# Patient Record
Sex: Male | Born: 1990 | Hispanic: Yes | Marital: Single | Smoking: Never smoker
Health system: Southern US, Community
[De-identification: ages and names within clinical notes are randomized; demographics above are authoritative.]

## PROBLEM LIST (undated history)

## (undated) DIAGNOSIS — Z789 Other specified health status: Secondary | ICD-10-CM

## (undated) HISTORY — PX: OTHER SURGICAL HISTORY: SHX169

---

## 2015-10-26 ENCOUNTER — Emergency Department: Payer: Self-pay

## 2015-10-26 ENCOUNTER — Encounter: Payer: Self-pay | Admitting: *Deleted

## 2015-10-26 ENCOUNTER — Emergency Department
Admission: EM | Admit: 2015-10-26 | Discharge: 2015-10-26 | Disposition: A | Discharge status: Home / Self Care | Payer: Self-pay | Attending: Emergency Medicine | Admitting: Emergency Medicine

## 2015-10-26 DIAGNOSIS — F10129 Alcohol abuse with intoxication, unspecified: Secondary | ICD-10-CM | POA: Insufficient documentation

## 2015-10-26 DIAGNOSIS — S20212A Contusion of left front wall of thorax, initial encounter: Secondary | ICD-10-CM | POA: Insufficient documentation

## 2015-10-26 DIAGNOSIS — R079 Chest pain, unspecified: Secondary | ICD-10-CM

## 2015-10-26 DIAGNOSIS — Y92410 Unspecified street and highway as the place of occurrence of the external cause: Secondary | ICD-10-CM | POA: Insufficient documentation

## 2015-10-26 DIAGNOSIS — R109 Unspecified abdominal pain: Secondary | ICD-10-CM

## 2015-10-26 DIAGNOSIS — Y998 Other external cause status: Secondary | ICD-10-CM | POA: Insufficient documentation

## 2015-10-26 DIAGNOSIS — Y9389 Activity, other specified: Secondary | ICD-10-CM | POA: Insufficient documentation

## 2015-10-26 DIAGNOSIS — S301XXA Contusion of abdominal wall, initial encounter: Secondary | ICD-10-CM | POA: Insufficient documentation

## 2015-10-26 DIAGNOSIS — F10929 Alcohol use, unspecified with intoxication, unspecified: Secondary | ICD-10-CM

## 2015-10-26 LAB — COMPREHENSIVE METABOLIC PANEL
ALBUMIN: 4.9 g/dL (ref 3.5–5.0)
ALK PHOS: 61 U/L (ref 38–126)
ALT: 21 U/L (ref 17–63)
ANION GAP: 10 (ref 5–15)
AST: 23 U/L (ref 15–41)
BILIRUBIN TOTAL: 0.5 mg/dL (ref 0.3–1.2)
BUN: 11 mg/dL (ref 6–20)
CALCIUM: 9.3 mg/dL (ref 8.9–10.3)
CO2: 26 mmol/L (ref 22–32)
CREATININE: 0.83 mg/dL (ref 0.61–1.24)
Chloride: 108 mmol/L (ref 101–111)
GFR calc Af Amer: >60 mL/min (ref >60)
GFR calc non Af Amer: >60 mL/min (ref >60)
GLUCOSE: 104 mg/dL — AB (ref 65–99)
Potassium: 3.3 mmol/L — ABNORMAL LOW (ref 3.5–5.1)
Sodium: 144 mmol/L (ref 135–145)
TOTAL PROTEIN: 8.4 g/dL — AB (ref 6.5–8.1)

## 2015-10-26 LAB — URINALYSIS COMPLETE WITH MICROSCOPIC (ARMC ONLY)
BACTERIA UA: NONE SEEN
BILIRUBIN URINE: NEGATIVE
GLUCOSE, UA: NEGATIVE mg/dL
Hgb urine dipstick: NEGATIVE
Ketones, ur: NEGATIVE mg/dL
Leukocytes, UA: NEGATIVE
Nitrite: NEGATIVE
Protein, ur: NEGATIVE mg/dL
SQUAMOUS EPITHELIAL / LPF: NONE SEEN
Specific Gravity, Urine: 1.004 — ABNORMAL LOW (ref 1.005–1.030)
pH: 6 (ref 5.0–8.0)

## 2015-10-26 LAB — CBC WITH DIFFERENTIAL/PLATELET
BASOS PCT: 2 %
Basophils Absolute: 0.2 10*3/uL — ABNORMAL HIGH (ref 0–0.1)
Eosinophils Absolute: 0.3 10*3/uL (ref 0–0.7)
Eosinophils Relative: 3 %
HEMATOCRIT: 47.9 % (ref 40.0–52.0)
HEMOGLOBIN: 16.4 g/dL (ref 13.0–18.0)
LYMPHS ABS: 2.6 10*3/uL (ref 1.0–3.6)
Lymphocytes Relative: 34 %
MCH: 30.4 pg (ref 26.0–34.0)
MCHC: 34.2 g/dL (ref 32.0–36.0)
MCV: 88.7 fL (ref 80.0–100.0)
MONOS PCT: 9 %
Monocytes Absolute: 0.7 10*3/uL (ref 0.2–1.0)
NEUTROS ABS: 3.9 10*3/uL (ref 1.4–6.5)
NEUTROS PCT: 52 %
Platelets: 195 10*3/uL (ref 150–440)
RBC: 5.4 MIL/uL (ref 4.40–5.90)
RDW: 13.7 % (ref 11.5–14.5)
WBC: 7.7 10*3/uL (ref 3.8–10.6)

## 2015-10-26 LAB — URINE DRUG SCREEN, QUALITATIVE (ARMC ONLY)
Amphetamines, Ur Screen: NOT DETECTED
BARBITURATES, UR SCREEN: NOT DETECTED
Benzodiazepine, Ur Scrn: NOT DETECTED
CANNABINOID 50 NG, UR ~~LOC~~: NOT DETECTED
COCAINE METABOLITE, UR ~~LOC~~: NOT DETECTED
MDMA (Ecstasy)Ur Screen: NOT DETECTED
Methadone Scn, Ur: NOT DETECTED
OPIATE, UR SCREEN: NOT DETECTED
PHENCYCLIDINE (PCP) UR S: NOT DETECTED
Tricyclic, Ur Screen: NOT DETECTED

## 2015-10-26 LAB — LIPASE, BLOOD: Lipase: 36 U/L (ref 11–51)

## 2015-10-26 LAB — ETHANOL: Alcohol, Ethyl (B): 249 mg/dL — ABNORMAL HIGH (ref <5)

## 2015-10-26 MED ORDER — IOHEXOL 300 MG/ML  SOLN
100.0000 mL | Freq: Once | INTRAMUSCULAR | Status: AC | PRN
  Administered 2015-10-26: 100 mL via INTRAVENOUS

## 2015-10-26 MED ORDER — ONDANSETRON HCL 4 MG/2ML IJ SOLN
4.0000 mg | Freq: Once | INTRAMUSCULAR | Status: AC
  Administered 2015-10-26: 4 mg via INTRAVENOUS

## 2015-10-26 MED ORDER — SODIUM CHLORIDE 0.9 % IV SOLN
Freq: Once | INTRAVENOUS | Status: AC
  Administered 2015-10-26: 02:00:00 via INTRAVENOUS

## 2015-10-26 NOTE — ED Notes (Addendum)
Pt forming complete sentences, alert and oriented X4. Interpreter at bedside for discharge instructions to be reviewed. Koleen NimrodAdrian, pt friend to come pick patient up. Interpreter spoke with pt ride. Awaiting ride.

## 2015-10-26 NOTE — ED Notes (Signed)
Pt informed to return if any life threatening symptoms occur.  Pt alert and oriented X4, active, cooperative, pt in NAD. RR even and unlabored, color WNL.   
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
## 2015-10-26 NOTE — ED Notes (Signed)
C-collar removed per verbal order by Manson PasseyBrown, MD.
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
## 2015-10-26 NOTE — ED Notes (Signed)
Interpreter at bedside. Pt gave phone number of friend to call for ride home. Friend called, no answer-voicemail not set up to leave message. Pt informed. Pt state that he will continue to call.

## 2015-10-26 NOTE — ED Notes (Signed)
Patient transported to CT 
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
## 2015-10-26 NOTE — ED Notes (Signed)
Pt friend, Koleen Nimroddrian here to pick up patient.

## 2015-11-26 NOTE — ED Provider Notes (Signed)
ED ECG REPORT on 10/26/2015 I, BROWN, Redfield N, the attending physician, personally viewed and interpreted this ECG.   Date: 11/26/2015  EKG Time: 1:37 AM  Rate: 112  Rhythm: Sinus tachycardia  Axis: None  Intervals: Normal  ST&T Change: None   Darci Currentandolph N Brown, MD 11/26/15 [phone]

## 2019-04-05 ENCOUNTER — Encounter: Payer: Self-pay | Admitting: Emergency Medicine

## 2019-04-05 ENCOUNTER — Other Ambulatory Visit: Payer: Self-pay

## 2019-04-05 ENCOUNTER — Inpatient Hospital Stay
Admission: EM | Admit: 2019-04-05 | Discharge: 2019-04-06 | DRG: 177 | Payer: HRSA Program | Attending: Internal Medicine | Admitting: Internal Medicine

## 2019-04-05 ENCOUNTER — Emergency Department: Payer: HRSA Program

## 2019-04-05 DIAGNOSIS — R0603 Acute respiratory distress: Secondary | ICD-10-CM | POA: Diagnosis present

## 2019-04-05 DIAGNOSIS — J1289 Other viral pneumonia: Secondary | ICD-10-CM | POA: Diagnosis present

## 2019-04-05 DIAGNOSIS — U071 COVID-19: Principal | ICD-10-CM | POA: Diagnosis present

## 2019-04-05 DIAGNOSIS — J189 Pneumonia, unspecified organism: Secondary | ICD-10-CM | POA: Diagnosis present

## 2019-04-05 DIAGNOSIS — J9601 Acute respiratory failure with hypoxia: Secondary | ICD-10-CM | POA: Diagnosis not present

## 2019-04-05 HISTORY — DX: Other specified health status: Z78.9

## 2019-04-05 LAB — COMPREHENSIVE METABOLIC PANEL
ALT: 60 U/L — ABNORMAL HIGH (ref 0–44)
AST: 48 U/L — ABNORMAL HIGH (ref 15–41)
Albumin: 3.9 g/dL (ref 3.5–5.0)
Alkaline Phosphatase: 46 U/L (ref 38–126)
Anion gap: 12 (ref 5–15)
BUN: 12 mg/dL (ref 6–20)
CO2: 25 mmol/L (ref 22–32)
Calcium: 8.7 mg/dL — ABNORMAL LOW (ref 8.9–10.3)
Chloride: 97 mmol/L — ABNORMAL LOW (ref 98–111)
Creatinine, Ser: 0.78 mg/dL (ref 0.61–1.24)
GFR calc Af Amer: >60 mL/min (ref >60)
GFR calc non Af Amer: >60 mL/min (ref >60)
Glucose, Bld: 119 mg/dL — ABNORMAL HIGH (ref 70–99)
Potassium: 3.9 mmol/L (ref 3.5–5.1)
Sodium: 134 mmol/L — ABNORMAL LOW (ref 135–145)
Total Bilirubin: 0.9 mg/dL (ref 0.3–1.2)
Total Protein: 8.2 g/dL — ABNORMAL HIGH (ref 6.5–8.1)

## 2019-04-05 LAB — CBC WITH DIFFERENTIAL/PLATELET
Abs Immature Granulocytes: 0.11 10*3/uL — ABNORMAL HIGH (ref 0.00–0.07)
Basophils Absolute: 0 10*3/uL (ref 0.0–0.1)
Basophils Relative: 0 %
Eosinophils Absolute: 0 10*3/uL (ref 0.0–0.5)
Eosinophils Relative: 0 %
HCT: 44.9 % (ref 39.0–52.0)
Hemoglobin: 15.1 g/dL (ref 13.0–17.0)
Immature Granulocytes: 1 %
Lymphocytes Relative: 7 %
Lymphs Abs: 0.7 10*3/uL (ref 0.7–4.0)
MCH: 30.1 pg (ref 26.0–34.0)
MCHC: 33.6 g/dL (ref 30.0–36.0)
MCV: 89.6 fL (ref 80.0–100.0)
Monocytes Absolute: 0.6 10*3/uL (ref 0.1–1.0)
Monocytes Relative: 6 %
Neutro Abs: 9 10*3/uL — ABNORMAL HIGH (ref 1.7–7.7)
Neutrophils Relative %: 86 %
Platelets: 204 10*3/uL (ref 150–400)
RBC: 5.01 MIL/uL (ref 4.22–5.81)
RDW: 12.5 % (ref 11.5–15.5)
Smear Review: NORMAL
WBC: 10.4 10*3/uL (ref 4.0–10.5)
nRBC: 0 % (ref 0.0–0.2)

## 2019-04-05 LAB — TROPONIN I: Troponin I: <0.03 ng/mL (ref <0.03)

## 2019-04-05 LAB — SARS CORONAVIRUS 2 BY RT PCR (HOSPITAL ORDER, PERFORMED IN ~~LOC~~ HOSPITAL LAB): SARS Coronavirus 2: NEGATIVE

## 2019-04-05 LAB — ABO/RH: ABO/RH(D): O POS

## 2019-04-05 LAB — LACTIC ACID, PLASMA: Lactic Acid, Venous: 1.1 mmol/L (ref 0.5–1.9)

## 2019-04-05 LAB — PROCALCITONIN: Procalcitonin: <0.1 ng/mL

## 2019-04-05 MED ORDER — SODIUM CHLORIDE 0.9 % IV SOLN
500.0000 mg | INTRAVENOUS | Status: DC
  Administered 2019-04-06: 500 mg via INTRAVENOUS
  Filled 2019-04-05: qty 500

## 2019-04-05 MED ORDER — SODIUM CHLORIDE 0.9 % IV SOLN
INTRAVENOUS | Status: DC
  Administered 2019-04-05 – 2019-04-06 (×2): via INTRAVENOUS

## 2019-04-05 MED ORDER — SODIUM CHLORIDE 0.9 % IV SOLN
1.0000 g | INTRAVENOUS | Status: DC
  Administered 2019-04-06: 1 g via INTRAVENOUS
  Filled 2019-04-05: qty 1

## 2019-04-05 MED ORDER — IBUPROFEN 600 MG PO TABS
600.0000 mg | ORAL_TABLET | Freq: Once | ORAL | Status: AC
  Administered 2019-04-05: 600 mg via ORAL
  Filled 2019-04-05: qty 1

## 2019-04-05 MED ORDER — BENZONATATE 100 MG PO CAPS: 100.0000 mg | ORAL_CAPSULE | Freq: Three times a day (TID) | ORAL | Status: DC | PRN

## 2019-04-05 MED ORDER — ACETAMINOPHEN 325 MG PO TABS
650.0000 mg | ORAL_TABLET | Freq: Four times a day (QID) | ORAL | Status: DC | PRN
  Administered 2019-04-05 – 2019-04-06 (×2): 650 mg via ORAL
  Filled 2019-04-05 (×3): qty 2

## 2019-04-05 MED ORDER — SODIUM CHLORIDE 0.9 % IV SOLN
500.0000 mg | Freq: Once | INTRAVENOUS | Status: AC
  Administered 2019-04-05: 12:00:00 500 mg via INTRAVENOUS
  Filled 2019-04-05: qty 500

## 2019-04-05 MED ORDER — ONDANSETRON HCL 4 MG PO TABS: 4.0000 mg | ORAL_TABLET | Freq: Four times a day (QID) | ORAL | Status: DC | PRN

## 2019-04-05 MED ORDER — ENOXAPARIN SODIUM 40 MG/0.4ML ~~LOC~~ SOLN: 40.0000 mg | SUBCUTANEOUS | Status: DC

## 2019-04-05 MED ORDER — ONDANSETRON HCL 4 MG/2ML IJ SOLN: 4.0000 mg | Freq: Four times a day (QID) | INTRAMUSCULAR | Status: DC | PRN

## 2019-04-05 MED ORDER — GUAIFENESIN-DM 100-10 MG/5ML PO SYRP
5.0000 mL | ORAL_SOLUTION | ORAL | Status: DC | PRN
  Administered 2019-04-05: 5 mL via ORAL
  Filled 2019-04-05: qty 5

## 2019-04-05 MED ORDER — HYDROCODONE-ACETAMINOPHEN 5-325 MG PO TABS: 1.0000 | ORAL_TABLET | ORAL | Status: DC | PRN

## 2019-04-05 MED ORDER — SODIUM CHLORIDE 0.9 % IV SOLN
1.0000 g | Freq: Once | INTRAVENOUS | Status: AC
  Administered 2019-04-05: 1 g via INTRAVENOUS
  Filled 2019-04-05: qty 10

## 2019-04-05 MED ORDER — ENOXAPARIN SODIUM 40 MG/0.4ML ~~LOC~~ SOLN
40.0000 mg | SUBCUTANEOUS | Status: DC
  Administered 2019-04-05: 40 mg via SUBCUTANEOUS
  Filled 2019-04-05: qty 0.4

## 2019-04-05 NOTE — Progress Notes (Signed)
Talked to [name] about patient spike a fever tonight was 102.9 at 2120 given tylenol and gave a tepid sponge bath, recheck after an hour and still has 101.4, per NP it will take awhile for it to go down, will reassessed again and will let incoming RN know. Also asked if we can have other cough medication, gave Robitussin awhile ago did help some but not much, NP will order tessalon pearls. No other concern at the moment. RN will continue to monitor.

## 2019-04-05 NOTE — ED Notes (Signed)
ED TO INPATIENT HANDOFF REPORT  ED Nurse Name and Phone #: Marisue Humble [phone]  W Name/Age/Gender Gerald Santiago 28 y.o. male Room/Bed: ED01A/ED01A  Code Status   Code Status: Not on file  Home/SNF/Other Home Patient oriented to: self, place, time and situation Is this baseline? Yes   Triage Complete: Triage complete  Chief Complaint vomiting  Triage Note Pt to ED via POV chest pain, shortness of breath, vomiting, headache. Pt was given antibiotics yesterday in ED in [place]. Pt states that he was diagnosed with "something with the lungs". Pt is tachypneic in triage.    Allergies No Known Allergies  Level of Care/Admitting Diagnosis ED Disposition    ED Disposition Condition Comment   Admit  Hospital Area: [facility] [[id number]]  Level of Care: Telemetry [5]  Covid Evaluation: Person Under Investigation (PUI)  Isolation Risk Level: Low Risk/Droplet (Less than 4L  supplementation)  Diagnosis: PNA (pneumonia) [295621]  Admitting Physician: Auburn Bilberry [[id number]]  Attending Physician: Auburn Bilberry [id number]  Estimated length of stay: past midnight tomorrow  Certification:: I certify this patient will need inpatient services for at least 2 midnights  PT Class (Do Not Modify): Inpatient [101]  PT Acc Code (Do Not Modify): Private [1]       B Medical/Surgery History Past Medical History:  Diagnosis Date  . Patient denies medical problems    Past Surgical History:  Procedure Laterality Date  . denies any surgery       A IV Location/Drains/Wounds Patient Lines/Drains/Airways Status   Active Line/Drains/Airways    Name:   Placement date:   Placement time:   Site:   Days:   Peripheral IV 04/05/19 Right Antecubital   04/05/19    1135    Antecubital   less than 1   Peripheral IV 04/05/19 Left Antecubital   04/05/19    1140    Antecubital   less than 1          Intake/Output Last 24 hours  Intake/Output Summary (Last 24 hours) at  04/05/2019 1434 Last data filed at 04/05/2019 1348 Gross per 24 hour  Intake 250 ml  Output -  Net 250 ml    Labs/Imaging Results for orders placed or performed during the hospital encounter of 04/05/19 (from the past 48 hour(s))  SARS Coronavirus 2 (CEPHEID- Performed in [facility] hospital lab), Hosp Order     Status: None   Collection Time: 04/05/19 11:32 AM  Result Value Ref Range   SARS Coronavirus 2 NEGATIVE NEGATIVE    Comment: (NOTE) If result is NEGATIVE SARS-CoV-2 target nucleic acids are NOT DETECTED. The SARS-CoV-2 RNA is generally detectable in upper and lower  respiratory specimens during the acute phase of infection. The lowest  concentration of SARS-CoV-2 viral copies this assay can detect is 250  copies / mL. A negative result does not preclude SARS-CoV-2 infection  and should not be used as the sole basis for treatment or other  patient management decisions.  A negative result may occur with  improper specimen collection / handling, submission of specimen other  than nasopharyngeal swab, presence of viral mutation(s) within the  areas targeted by this assay, and inadequate number of viral copies  (<250 copies / mL). A negative result must be combined with clinical  observations, patient history, and epidemiological information. If result is POSITIVE SARS-CoV-2 target nucleic acids are DETECTED. The SARS-CoV-2 RNA is generally detectable in upper and lower  respiratory specimens dur ing the acute phase of infection.  Positive  results are indicative of active infection with SARS-CoV-2.  Clinical  correlation with patient history and other diagnostic information is  necessary to determine patient infection status.  Positive results do  not rule out bacterial infection or co-infection with other viruses. If result is PRESUMPTIVE POSTIVE SARS-CoV-2 nucleic acids MAY BE PRESENT.   A presumptive positive result was obtained on the submitted specimen  and confirmed  on repeat testing.  While 2019 novel coronavirus  (SARS-CoV-2) nucleic acids may be present in the submitted sample  additional confirmatory testing may be necessary for epidemiological  and / or clinical management purposes  to differentiate between  SARS-CoV-2 and other Sarbecovirus currently known to infect humans.  If clinically indicated additional testing with an alternate test  methodology [phone]) is advised. The SARS-CoV-2 RNA is generally  detectable in upper and lower respiratory sp ecimens during the acute  phase of infection. The expected result is Negative. Fact Sheet for Patients:  BoilerBrush.com.cy Fact Sheet for Healthcare Providers: https://pope.com/ This test is not yet approved or cleared by the [place] FDA and has been authorized for detection and/or diagnosis of SARS-CoV-2 by FDA under an Emergency Use Authorization (EUA).  This EUA will remain in effect (meaning this test can be used) for the duration of the COVID-19 declaration under Section 564(b)(1) of the Act, 21 U.S.C. section 360bbb-3(b)(1), unless the authorization is terminated or revoked sooner. Performed at [facility], [address] [place], [place] [address]   CBC with Differential/Platelet     Status: Abnormal   Collection Time: 04/05/19 11:32 AM  Result Value Ref Range   WBC 10.4 4.0 - 10.5 K/uL   RBC 5.01 4.22 - 5.81 MIL/uL   Hemoglobin 15.1 13.0 - 17.0 g/dL   HCT 76.7 34.1 - 93.7 %   MCV 89.6 80.0 - 100.0 fL   MCH 30.1 26.0 - 34.0 pg   MCHC 33.6 30.0 - 36.0 g/dL   RDW 90.2 40.9 - 73.5 %   Platelets 204 150 - 400 K/uL   nRBC 0.0 0.0 - 0.2 %   Neutrophils Relative % 86 %   Neutro Abs 9.0 (H) 1.7 - 7.7 K/uL   Lymphocytes Relative 7 %   Lymphs Abs 0.7 0.7 - 4.0 K/uL   Monocytes Relative 6 %   Monocytes Absolute 0.6 0.1 - 1.0 K/uL   Eosinophils Relative 0 %   Eosinophils Absolute 0.0 0.0 - 0.5 K/uL   Basophils Relative 0  %   Basophils Absolute 0.0 0.0 - 0.1 K/uL   WBC Morphology MORPHOLOGY UNREMARKABLE    RBC Morphology MORPHOLOGY UNREMARKABLE    Smear Review Normal platelet morphology    Immature Granulocytes 1 %   Abs Immature Granulocytes 0.11 (H) 0.00 - 0.07 K/uL    Comment: Performed at [facility], [address] [place], [place] [address]  Comprehensive metabolic panel     Status: Abnormal   Collection Time: 04/05/19 11:32 AM  Result Value Ref Range   Sodium 134 (L) 135 - 145 mmol/L   Potassium 3.9 3.5 - 5.1 mmol/L   Chloride 97 (L) 98 - 111 mmol/L   CO2 25 22 - 32 mmol/L   Glucose, Bld 119 (H) 70 - 99 mg/dL   BUN 12 6 - 20 mg/dL   Creatinine, Ser 4.26 0.61 - 1.24 mg/dL   Calcium 8.7 (L) 8.9 - 10.3 mg/dL   Total Protein 8.2 (H) 6.5 - 8.1 g/dL   Albumin 3.9 3.5 - 5.0 g/dL  AST 48 (H) 15 - 41 U/L   ALT 60 (H) 0 - 44 U/L   Alkaline Phosphatase 46 38 - 126 U/L   Total Bilirubin 0.9 0.3 - 1.2 mg/dL   GFR calc non Af Amer >60 >60 mL/min   GFR calc Af Amer >60 >60 mL/min   Anion gap 12 5 - 15    Comment: Performed at [facility], [address] [place], [place] [address]  Troponin I - ONCE - STAT     Status: None   Collection Time: 04/05/19 11:32 AM  Result Value Ref Range   Troponin I <0.03 <0.03 ng/mL    Comment: Performed at [facility], [address] [place], [place] [address]  Lactic acid, plasma     Status: None   Collection Time: 04/05/19 11:33 AM  Result Value Ref Range   Lactic Acid, Venous 1.1 0.5 - 1.9 mmol/L    Comment: Performed at [facility], [address] [place], [place] [address]   Dg Chest Portable 1 View  Result Date: 04/05/2019 CLINICAL DATA:  Fever and shortness of breath EXAM: PORTABLE CHEST 1 VIEW COMPARISON:  Chest CT October 26, 2015 FINDINGS: There is airspace opacity in the left lower lobe. There is slight right base atelectasis. Lungs elsewhere clear. Heart size and pulmonary vascularity are normal. No  adenopathy. No bone lesions. IMPRESSION: Left lower lobe infiltrate consistent with pneumonia. Slight right base atelectasis. Heart size within normal limits. No evident adenopathy. Electronically Signed   By: Bretta BangWilliam  Woodruff III M.D.   On: 04/05/2019 11:41    Pending Labs Unresulted Labs (From admission, onward)    Start     Ordered   04/05/19 1308  Procalcitonin - Baseline  ONCE - STAT,   STAT     04/05/19 1307   04/05/19 1107  Blood culture (routine x 2)  BLOOD CULTURE X 2,   STAT     04/05/19 1106   Signed and Held  HIV antibody (Routine Testing)  Once,   R     Signed and Held   Signed and Held  ABO/Rh  Once,   R     Signed and Held   Signed and Held  CBC  (enoxaparin (LOVENOX)    CrCl >/= 30 ml/min)  Once,   R    Comments:  Baseline for enoxaparin therapy IF NOT ALREADY DRAWN.  Notify MD if PLT < 100 K.    Signed and Held   Signed and Held  Creatinine, serum  (enoxaparin (LOVENOX)    CrCl >/= 30 ml/min)  Once,   R    Comments:  Baseline for enoxaparin therapy IF NOT ALREADY DRAWN.    Signed and Held   Signed and Held  Creatinine, serum  (enoxaparin (LOVENOX)    CrCl >/= 30 ml/min)  Weekly,   R    Comments:  while on enoxaparin therapy    Signed and Held   Signed and Held  SARS Coronavirus 2 [facility](Hospital order, Performed in [facility] hospital lab)  (Novel Coronavirus, [facility](Hospital Order))  Tomorrow morning,   R     Signed and Held   Signed and Held  Respiratory Panel by PCR  Add-on,   R     Signed and Held   Signed and Held  CBC with Differential/Platelet  Daily,   R     Signed and Held   Signed and Held  Comprehensive metabolic panel  Daily,   R  Signed and Held          Vitals/Pain Today's Vitals   04/05/19 1230 04/05/19 1300 04/05/19 1348 04/05/19 1400  BP: 132/79 129/78 120/69 117/69  Pulse: 98 99 91 92  Resp: (!) 28 (!) 27 (!) 30 (!) 31  Temp:      TempSrc:      SpO2: 100% 94% 97% 97%  PainSc:        Isolation Precautions Droplet and Contact  precautions  Medications Medications  cefTRIAXone (ROCEPHIN) 1 g in sodium chloride 0.9 % 100 mL IVPB (has no administration in time range)  azithromycin (ZITHROMAX) 500 mg in sodium chloride 0.9 % 250 mL IVPB (has no administration in time range)  cefTRIAXone (ROCEPHIN) 1 g in sodium chloride 0.9 % 100 mL IVPB (1 g Intravenous New Bag/Given 04/05/19 1159)  azithromycin (ZITHROMAX) 500 mg in sodium chloride 0.9 % 250 mL IVPB (0 mg Intravenous Stopped 04/05/19 1348)  ibuprofen (ADVIL) tablet 600 mg (600 mg Oral Given 04/05/19 1421)    Mobility walks with person assist Low fall risk   Focused Assessments Pulmonary Assessment Handoff:  :   Sats at rest 98% on RA, dropped to 93% on exertion, pt very SOB upon exertion, and SOB at rest.      R Recommendations: See Admitting Provider Note  Report given to:   Additional Notes: Pt speaks Spanish. Pt very SOB upon any exertion. Tolerating sips well.

## 2019-04-05 NOTE — ED Notes (Addendum)
Pt ambulated in room and hall for 2 minutes. O2 sat 98% when resting, dropped to 92-93% while walking, and once, briefly to 89%. Pt. Had to rest after 1 minute of walking because of SOB. RR 34 while ambulating and upon return to bed. Pt was wearing mask during ambulation. O2 sat up to 97% after returning to bed.

## 2019-04-05 NOTE — H&P (Signed)
[facility] - [place] [facility]   PATIENT NAME: Gerald Santiago    MR#:  [id number]  DATE OF BIRTH:  08/27/1991  DATE OF ADMISSION:  04/05/2019  PRIMARY CARE PHYSICIAN: Patient, No Pcp Per   REQUESTING/REFERRING PHYSICIAN:Veronese, [address], MD  CHIEF COMPLAINT:   Chief Complaint  Patient presents with  . Emesis    HISTORY OF PRESENT ILLNESS: Gerald Santiago  is a 28 y.o. male with no medical history Presents to the emergency room for evaluation of shortness of breath.  Patient states that he was seen at another facility yesterday and was diagnosed with pneumonia.  He was placed on antibiotics however he did not get his antibiotics and he started getting more short of breath and weak.  He denies any exposures to COVID.  Complains of fever and chills.Marland Kitchen. PAST MEDICAL HISTORY:   Past Medical History:  Diagnosis Date  . Patient denies medical problems     PAST SURGICAL HISTORY:  Past Surgical History:  Procedure Laterality Date  . denies any surgery      SOCIAL HISTORY:  Social History   Tobacco Use  . Smoking status: Never Smoker  . Smokeless tobacco: Never Used  Substance Use Topics  . Alcohol use: Yes    FAMILY HISTORY:  Family History  Family history unknown: Yes    DRUG ALLERGIES: No Known Allergies  REVIEW OF SYSTEMS:   CONSTITUTIONAL: Positive fever, positive fatigue or weakness.  EYES: No blurred or double vision.  EARS, NOSE, AND THROAT: No tinnitus or ear pain.  RESPIRATORY: Positive shortness of breath and cough cARDIOVASCULAR: No chest pain, orthopnea, edema.  GASTROINTESTINAL: No nausea, vomiting, diarrhea or abdominal pain.  GENITOURINARY: No dysuria, hematuria.  ENDOCRINE: No polyuria, nocturia,  HEMATOLOGY: No anemia, easy bruising or bleeding SKIN: No rash or lesion. MUSCULOSKELETAL: No joint pain or arthritis.   NEUROLOGIC: No tingling, numbness, weakness.  PSYCHIATRY: No anxiety or depression.    MEDICATIONS AT HOME:  Prior to Admission medications   Not on File      PHYSICAL EXAMINATION:   VITAL SIGNS: Blood pressure 120/69, pulse 91, temperature 100.2 F (37.9 C), temperature source Oral, resp. rate (!) 30, SpO2 97 %.  GENERAL:  28 y.o.-year-old patient lying in the bed with no acute distress.  EYES: Pupils equal, round, reactive to light and accommodation. No scleral icterus. Extraocular muscles intact.  HEENT: Head atraumatic, normocephalic. Oropharynx and nasopharynx clear.  NECK:  Supple, no jugular venous distention. No thyroid enlargement, no tenderness.  LUNGS: Decreased breath sounds CARDIOVASCULAR: S1, S2 normal. No murmurs, rubs, or gallops.  ABDOMEN: Soft, nontender, nondistended. Bowel sounds present. No organomegaly or mass.  EXTREMITIES: No pedal edema, cyanosis, or clubbing.  NEUROLOGIC: Cranial nerves II through XII are intact. Muscle strength 5/5 in all extremities. Sensation intact. Gait not checked.  PSYCHIATRIC: The patient is alert and oriented x 3.  SKIN: No obvious rash, lesion, or ulcer.   LABORATORY PANEL:   CBC Recent Labs  Lab 04/05/19 1132  WBC 10.4  HGB 15.1  HCT 44.9  PLT 204  MCV 89.6  MCH 30.1  MCHC 33.6  RDW 12.5  LYMPHSABS 0.7  MONOABS 0.6  EOSABS 0.0  BASOSABS 0.0   ------------------------------------------------------------------------------------------------------------------  Chemistries  Recent Labs  Lab 04/05/19 1132  NA 134*  K 3.9  CL 97*  CO2 25  GLUCOSE 119*  BUN 12  CREATININE 0.78  CALCIUM 8.7*  AST 48*  ALT 60*  ALKPHOS 46  BILITOT 0.9   ------------------------------------------------------------------------------------------------------------------  CrCl cannot be calculated (Unknown ideal weight.). ------------------------------------------------------------------------------------------------------------------ No results for input(s): TSH, T4TOTAL, T3FREE, THYROIDAB in the last 72  hours.  Invalid input(s): FREET3   Coagulation profile No results for input(s): INR, PROTIME in the last 168 hours. ------------------------------------------------------------------------------------------------------------------- No results for input(s): DDIMER in the last 72 hours. -------------------------------------------------------------------------------------------------------------------  Cardiac Enzymes Recent Labs  Lab 04/05/19 1132  TROPONINI <0.03   ------------------------------------------------------------------------------------------------------------------ Invalid input(s): POCBNP  ---------------------------------------------------------------------------------------------------------------  Urinalysis    Component Value Date/Time   COLORURINE STRAW (A) 10/26/2015 0136   APPEARANCEUR CLEAR (A) 10/26/2015 0136   LABSPEC 1.004 (L) 10/26/2015 0136   PHURINE 6.0 10/26/2015 0136   GLUCOSEU NEGATIVE 10/26/2015 0136   HGBUR NEGATIVE 10/26/2015 0136   BILIRUBINUR NEGATIVE 10/26/2015 0136   KETONESUR NEGATIVE 10/26/2015 0136   PROTEINUR NEGATIVE 10/26/2015 0136   NITRITE NEGATIVE 10/26/2015 0136   LEUKOCYTESUR NEGATIVE 10/26/2015 0136     RADIOLOGY: Dg Chest Portable 1 View  Result Date: 04/05/2019 CLINICAL DATA:  Fever and shortness of breath EXAM: PORTABLE CHEST 1 VIEW COMPARISON:  Chest CT October 26, 2015 FINDINGS: There is airspace opacity in the left lower lobe. There is slight right base atelectasis. Lungs elsewhere clear. Heart size and pulmonary vascularity are normal. No adenopathy. No bone lesions. IMPRESSION: Left lower lobe infiltrate consistent with pneumonia. Slight right base atelectasis. Heart size within normal limits. No evident adenopathy. Electronically Signed   By: Bretta Bang III M.D.   On: 04/05/2019 11:41    EKG: Orders placed or performed during the hospital encounter of 04/05/19  . ED EKG  . ED EKG    IMPRESSION AND  PLAN: Patient is 29 year old presenting with fever cough shortness of breath  1.  Fever cough shortness of breath Due to likely pneumonia Even though COVID-19 is negative suspicion for COVID-19 is still high We will place him on precaution We will treat with IV antibiotic Monitor respiratory status  2.  Bradycardia monitor heart rate on telemetry   3.  Miscellaneous Lovenox for DVT prophylaxis   All the records are reviewed and case discussed with ED provider. Management plans discussed with the patient, family and they are in agreement.  CODE STATUS: Full   TOTAL TIME TAKING CARE OF THIS PATIENT: .    Auburn Bilberry M.D on 04/05/2019 at 1:56 PM  Between 7am to 6pm - Pager - [phone]  After 6pm go to [email or web address] - password [email or web address] [facility]  [facility] Office  [phone]  CC: Primary care physician; Patient, No Pcp Per

## 2019-04-05 NOTE — ED Notes (Signed)
Patient resting, resp appear less labored. IV antibiotics infusing.

## 2019-04-05 NOTE — Progress Notes (Signed)
Chaplain attempted a visit, RN reported pt had a rough evening and would not be able to ask questions today. Chaplain plans to make a follow up call to pt's room tomorrow

## 2019-04-05 NOTE — ED Triage Notes (Signed)
Pt to ED via POV chest pain, shortness of breath, vomiting, headache. Pt was given antibiotics yesterday in ED in [place]. Pt states that he was diagnosed with "something with the lungs". Pt is tachypneic in triage.

## 2019-04-05 NOTE — ED Provider Notes (Signed)
[facility] Emergency Department Provider Note  ____________________________________________  Time seen: Approximately 1:04 PM  I have reviewed the triage vital signs and the nursing notes.   HISTORY  Chief Complaint Emesis   HPI Gerald Santiago is a 28 y.o. male no significant past medical history who presents for evaluation of shortness of breath.  Patient was seen in outside hospital yesterday with diagnosed with pneumonia.  He was put on some antibiotic.  He reports that today his shortness of breath became worse.  He reports that he is very weak and becomes extremely dyspneic with minimal exertion.  He denies fever or chills, cough, vomiting or diarrhea.  No known exposures to COVID.  He denies chest pain.  He denies any personal family history of blood clots, recent travel mobilization, leg pain or swelling, hemoptysis, or exogenous hormones.  Is not a smoker.  Denies history of COPD or asthma.  PMH None - reviewed  Prior to Admission medications   Not on File    Allergies Patient has no known allergies.  No family history on file.  Social History Social History   Tobacco Use  . Smoking status: Never Smoker  . Smokeless tobacco: Never Used  Substance Use Topics  . Alcohol use: Yes  . Drug use: Not Currently    Review of Systems  Constitutional: Negative for fever. Eyes: Negative for visual changes. ENT: Negative for sore throat. Neck: No neck pain  Cardiovascular: Negative for chest pain. Respiratory: + shortness of breath. Gastrointestinal: Negative for abdominal pain, vomiting or diarrhea. Genitourinary: Negative for dysuria. Musculoskeletal: Negative for back pain. Skin: Negative for rash. Neurological: Negative for headaches, weakness or numbness. Psych: No SI or HI  ____________________________________________   PHYSICAL EXAM:  VITAL SIGNS: ED Triage Vitals  Enc Vitals Group     BP 04/05/19 1053 117/69   Pulse Rate 04/05/19 1053 94     Resp 04/05/19 1053 (!) 40     Temp 04/05/19 1053 (!) 100.9 F (38.3 C)     Temp Source 04/05/19 1053 Oral     SpO2 04/05/19 1053 94 %     Weight --      Height --      Head Circumference --      Peak Flow --      Pain Score 04/05/19 1059 7     Pain Loc --      Pain Edu? --      Excl. in GC? --     Constitutional: Alert and oriented, mild increased work of breathing.  HEENT:      Head: Normocephalic and atraumatic.         Eyes: Conjunctivae are normal. Sclera is non-icteric.       Mouth/Throat: Mucous membranes are moist.       Neck: Supple with no signs of meningismus. Cardiovascular: Tachycardic with regular rhythm. No murmurs, gallops, or rubs. 2+ symmetrical distal pulses are present in all extremities. No JVD. Respiratory: Increased work of breathing, tachypneic, with crackles on the left base  Gastrointestinal: Soft, non tender, and non distended with positive bowel sounds. No rebound or guarding. Musculoskeletal: Nontender with normal range of motion in all extremities. No edema, cyanosis, or erythema of extremities. Neurologic: Normal speech and language. Face is symmetric. Moving all extremities. No gross focal neurologic deficits are appreciated. Skin: Skin is warm, dry and intact. No rash noted. Psychiatric: Mood and affect are normal. Speech and behavior are normal.  ____________________________________________   LABS (  all labs ordered are listed, but only abnormal results are displayed)  Labs Reviewed  COMPREHENSIVE METABOLIC PANEL - Abnormal; Notable for the following components:      Result Value   Sodium 134 (*)    Chloride 97 (*)    Glucose, Bld 119 (*)    Calcium 8.7 (*)    Total Protein 8.2 (*)    AST 48 (*)    ALT 60 (*)    All other components within normal limits  SARS CORONAVIRUS 2 (HOSPITAL ORDER, PERFORMED IN [facility])  CULTURE, BLOOD (ROUTINE X 2)  CULTURE, BLOOD (ROUTINE X 2)  CBC WITH  DIFFERENTIAL/PLATELET  LACTIC ACID, PLASMA  TROPONIN I   ____________________________________________  EKG  ED ECG REPORT I, Nita Sicklearolina Axavier Pressley, the attending physician, personally viewed and interpreted this ECG.  Sinus tachycardia, rate of 103, normal intervals, normal axis, no ST elevations or depressions. ____________________________________________  RADIOLOGY  I have personally reviewed the images performed during this visit and I agree with the Radiologist's read.   Interpretation by Radiologist:  Dg Chest Portable 1 View  Result Date: 04/05/2019 CLINICAL DATA:  Fever and shortness of breath EXAM: PORTABLE CHEST 1 VIEW COMPARISON:  Chest CT October 26, 2015 FINDINGS: There is airspace opacity in the left lower lobe. There is slight right base atelectasis. Lungs elsewhere clear. Heart size and pulmonary vascularity are normal. No adenopathy. No bone lesions. IMPRESSION: Left lower lobe infiltrate consistent with pneumonia. Slight right base atelectasis. Heart size within normal limits. No evident adenopathy. Electronically Signed   By: Bretta BangWilliam  Woodruff III M.D.   On: 04/05/2019 11:41     ____________________________________________   PROCEDURES  Procedure(s) performed: None Procedures Critical Care performed:  None ____________________________________________   INITIAL IMPRESSION / ASSESSMENT AND PLAN / ED COURSE  28 y.o. male no significant past medical history who presents for evaluation of shortness of breath.  Patient is tachypneic with significant increased work of breathing, not truly hypoxic.  Borderline tachycardia with a low-grade fever of 100.9.  Normal white count and normal lactic.  Chest x-ray concerning for pneumonia.  Will treat for community-acquired pneumonia with azithromycin and Rocephin.  Due to significantly increased work of breathing will get patient admitted to the hospital for monitoring.  COVID negative.  Discussed with Dr. Allena KatzPatel who will admit  patient      As part of my medical decision making, I reviewed the following data within the electronic MEDICAL RECORD NUMBER Nursing notes reviewed and incorporated, Labs reviewed , EKG interpreted , Old chart reviewed, Radiograph reviewed , Discussed with admitting physician , Notes from prior ED visits and [place] Controlled Substance Database    Pertinent labs & imaging results that were available during my care of the patient were reviewed by me and considered in my medical decision making (see chart for details).    ____________________________________________   FINAL CLINICAL IMPRESSION(S) / ED DIAGNOSES  Final diagnoses:  Community acquired pneumonia, unspecified laterality  Respiratory distress      NEW MEDICATIONS STARTED DURING THIS VISIT:  ED Discharge Orders    None       Note:  This document was prepared using Dragon voice recognition software and may include unintentional dictation errors.    Don PerkingVeronese, [place], MD 04/05/19 [phone]

## 2019-04-06 ENCOUNTER — Encounter (HOSPITAL_COMMUNITY): Payer: Self-pay | Admitting: *Deleted

## 2019-04-06 ENCOUNTER — Inpatient Hospital Stay (HOSPITAL_COMMUNITY): Payer: HRSA Program

## 2019-04-06 ENCOUNTER — Other Ambulatory Visit: Payer: Self-pay

## 2019-04-06 ENCOUNTER — Inpatient Hospital Stay (HOSPITAL_COMMUNITY)
Admission: AD | Admit: 2019-04-06 | Discharge: 2019-04-08 | DRG: 177 | Disposition: A | Discharge status: Home / Self Care | Payer: HRSA Program | Source: Other Acute Inpatient Hospital | Attending: Internal Medicine | Admitting: Internal Medicine

## 2019-04-06 DIAGNOSIS — Z79899 Other long term (current) drug therapy: Secondary | ICD-10-CM | POA: Diagnosis not present

## 2019-04-06 DIAGNOSIS — J9601 Acute respiratory failure with hypoxia: Secondary | ICD-10-CM | POA: Diagnosis present

## 2019-04-06 DIAGNOSIS — U071 COVID-19: Secondary | ICD-10-CM | POA: Diagnosis present

## 2019-04-06 DIAGNOSIS — Z7902 Long term (current) use of antithrombotics/antiplatelets: Secondary | ICD-10-CM | POA: Diagnosis not present

## 2019-04-06 DIAGNOSIS — R0602 Shortness of breath: Secondary | ICD-10-CM | POA: Diagnosis present

## 2019-04-06 DIAGNOSIS — J1289 Other viral pneumonia: Secondary | ICD-10-CM | POA: Diagnosis present

## 2019-04-06 LAB — STREP PNEUMONIAE URINARY ANTIGEN: Strep Pneumo Urinary Antigen: NEGATIVE

## 2019-04-06 LAB — CBC WITH DIFFERENTIAL/PLATELET
Abs Immature Granulocytes: 0.18 10*3/uL — ABNORMAL HIGH (ref 0.00–0.07)
Basophils Absolute: 0 10*3/uL (ref 0.0–0.1)
Basophils Relative: 0 %
Eosinophils Absolute: 0 10*3/uL (ref 0.0–0.5)
Eosinophils Relative: 0 %
HCT: 41 % (ref 39.0–52.0)
Hemoglobin: 13.7 g/dL (ref 13.0–17.0)
Immature Granulocytes: 2 %
Lymphocytes Relative: 7 %
Lymphs Abs: 0.7 10*3/uL (ref 0.7–4.0)
MCH: 30 pg (ref 26.0–34.0)
MCHC: 33.4 g/dL (ref 30.0–36.0)
MCV: 89.7 fL (ref 80.0–100.0)
Monocytes Absolute: 0.6 10*3/uL (ref 0.1–1.0)
Monocytes Relative: 5 %
Neutro Abs: 9.2 10*3/uL — ABNORMAL HIGH (ref 1.7–7.7)
Neutrophils Relative %: 86 %
Platelets: 223 10*3/uL (ref 150–400)
RBC: 4.57 MIL/uL (ref 4.22–5.81)
RDW: 12.5 % (ref 11.5–15.5)
Smear Review: NORMAL
WBC: 10.7 10*3/uL — ABNORMAL HIGH (ref 4.0–10.5)
nRBC: 0 % (ref 0.0–0.2)

## 2019-04-06 LAB — COMPREHENSIVE METABOLIC PANEL
ALT: 53 U/L — ABNORMAL HIGH (ref 0–44)
AST: 39 U/L (ref 15–41)
Albumin: 3.3 g/dL — ABNORMAL LOW (ref 3.5–5.0)
Alkaline Phosphatase: 43 U/L (ref 38–126)
Anion gap: 9 (ref 5–15)
BUN: 11 mg/dL (ref 6–20)
CO2: 23 mmol/L (ref 22–32)
Calcium: 8.1 mg/dL — ABNORMAL LOW (ref 8.9–10.3)
Chloride: 104 mmol/L (ref 98–111)
Creatinine, Ser: 0.66 mg/dL (ref 0.61–1.24)
GFR calc Af Amer: >60 mL/min (ref >60)
GFR calc non Af Amer: >60 mL/min (ref >60)
Glucose, Bld: 111 mg/dL — ABNORMAL HIGH (ref 70–99)
Potassium: 3.8 mmol/L (ref 3.5–5.1)
Sodium: 136 mmol/L (ref 135–145)
Total Bilirubin: 0.7 mg/dL (ref 0.3–1.2)
Total Protein: 7.1 g/dL (ref 6.5–8.1)

## 2019-04-06 LAB — RESPIRATORY PANEL BY PCR

## 2019-04-06 LAB — FERRITIN: Ferritin: 1470 ng/mL — ABNORMAL HIGH (ref 24–336)

## 2019-04-06 LAB — SARS CORONAVIRUS 2 BY RT PCR (HOSPITAL ORDER, PERFORMED IN ~~LOC~~ HOSPITAL LAB): SARS Coronavirus 2: POSITIVE — AB

## 2019-04-06 LAB — C-REACTIVE PROTEIN: CRP: 15.8 mg/dL — ABNORMAL HIGH (ref <1.0)

## 2019-04-06 LAB — FIBRIN DERIVATIVES D-DIMER (ARMC ONLY): Fibrin derivatives D-dimer (ARMC): 828.63 ng/mL (FEU) — ABNORMAL HIGH (ref 0.00–499.00)

## 2019-04-06 MED ORDER — SODIUM CHLORIDE 0.9 % IV SOLN: 250.0000 mL | INTRAVENOUS | Status: DC | PRN

## 2019-04-06 MED ORDER — ALBUTEROL SULFATE HFA 108 (90 BASE) MCG/ACT IN AERS
2.0000 | INHALATION_SPRAY | Freq: Four times a day (QID) | RESPIRATORY_TRACT | Status: DC
  Administered 2019-04-06 – 2019-04-08 (×8): 2 via RESPIRATORY_TRACT
  Filled 2019-04-06: qty 6.7

## 2019-04-06 MED ORDER — ACETAMINOPHEN 325 MG PO TABS
650.0000 mg | ORAL_TABLET | Freq: Four times a day (QID) | ORAL | Status: DC | PRN
  Administered 2019-04-06: 650 mg via ORAL
  Filled 2019-04-06: qty 2

## 2019-04-06 MED ORDER — HYDROCOD POLST-CPM POLST ER 10-8 MG/5ML PO SUER: 5.0000 mL | Freq: Two times a day (BID) | ORAL | Status: DC | PRN

## 2019-04-06 MED ORDER — POLYETHYLENE GLYCOL 3350 17 G PO PACK: 17.0000 g | PACK | Freq: Every day | ORAL | Status: DC | PRN

## 2019-04-06 MED ORDER — SODIUM CHLORIDE 0.9% FLUSH
3.0000 mL | Freq: Two times a day (BID) | INTRAVENOUS | Status: DC
  Administered 2019-04-07 (×3): 3 mL via INTRAVENOUS

## 2019-04-06 MED ORDER — ENOXAPARIN SODIUM 40 MG/0.4ML ~~LOC~~ SOLN
40.0000 mg | SUBCUTANEOUS | Status: DC
  Administered 2019-04-06 – 2019-04-07 (×2): 40 mg via SUBCUTANEOUS
  Filled 2019-04-06 (×2): qty 0.4

## 2019-04-06 MED ORDER — BENZONATATE 100 MG PO CAPS: 100.0000 mg | ORAL_CAPSULE | Freq: Three times a day (TID) | ORAL | 0 refills | Status: DC | PRN

## 2019-04-06 MED ORDER — ONDANSETRON HCL 4 MG PO TABS: 4.0000 mg | ORAL_TABLET | Freq: Four times a day (QID) | ORAL | Status: DC | PRN

## 2019-04-06 MED ORDER — GUAIFENESIN-DM 100-10 MG/5ML PO SYRP: 5.0000 mL | ORAL_SOLUTION | ORAL | 0 refills | Status: DC | PRN

## 2019-04-06 MED ORDER — ENOXAPARIN SODIUM 40 MG/0.4ML ~~LOC~~ SOLN: 40.0000 mg | SUBCUTANEOUS | Status: DC

## 2019-04-06 MED ORDER — ONDANSETRON HCL 4 MG/2ML IJ SOLN: 4.0000 mg | Freq: Four times a day (QID) | INTRAMUSCULAR | Status: DC | PRN

## 2019-04-06 MED ORDER — SODIUM CHLORIDE 0.9% FLUSH
3.0000 mL | INTRAVENOUS | Status: DC | PRN
  Administered 2019-04-06 (×2): 3 mL via INTRAVENOUS
  Filled 2019-04-06 (×2): qty 3

## 2019-04-06 MED ORDER — GUAIFENESIN-DM 100-10 MG/5ML PO SYRP: 10.0000 mL | ORAL_SOLUTION | ORAL | Status: DC | PRN

## 2019-04-06 NOTE — Progress Notes (Signed)
Pt up with help and ambulatory to bathroom this am, VSS, remains on 02 at 2l , still co mild SOB via interpreter services, but o2 sats 94=97%, denies any other complaints, but did not eat much for breakfast. Md in to see and via ntereent interpreter services informed pt of transfer to [facility]. EMS on route.

## 2019-04-06 NOTE — Progress Notes (Signed)
   04/06/19 1400  Clinical Encounter Type  Visited With Patient not available   OR received to complete or update an Advanced Directive. Unfortunately, the patient was transferred to [facility] campus prior to reaching him by phone.

## 2019-04-06 NOTE — H&P (Signed)
HISTORY AND PHYSICAL       PATIENT DETAILS Name: Gerald Santiago Age: 28 y.o. Sex: male Date of Birth: [birth year] Admit Date: 04/06/2019 [id number], No Pcp Per   Patient coming from: [facility]   CHIEF COMPLAINT:  Shortness of breath  HPI: Gerald Santiago is a 28 y.o. male with no significant medical history significant presented to [facility] see today as a transfer from [facility].  He initially presented to [facility] yesterday with fever and shortness of breath that was ongoing for the past few days.  He actually was seen at a local urgent care-and apparently was told he had pneumonia and was started on antimicrobial therapy.  Since he started developing shortness of breath, and started getting weak-he presented to [facility] on 5/23.  COVID 19 test was negative, patient was found to have bilateral infiltrates on chest x-ray and subsequently admitted to the hospitalist service at [facility].  Due to high suspicion for COVID-19, the test was repeated on 5/24 and was positive.  As result patient was transferred to triad hospitalist service at [facility].  During my evaluation-patient was lying comfortably on 2-3 L.  He was not struggling to breathe.  There is no history of headache, chest pain, nausea, vomiting or diarrhea.  He denies any abdominal pain.   REVIEW OF SYSTEMS:  Constitutional:   No  weight loss, night sweats,  Fevers, chills, fatigue.  HEENT:    No headaches, Dysphagia,Tooth/dental problems,Sore throat  Cardio-vascular: No chest pain,Orthopnea, PND,lower extremity edema, anasarca, palpitations  GI:  No heartburn, indigestion, abdominal pain, nausea, vomiting, diarrhea, melena or hematochezia  Resp: No hemoptysis,plueritic chest pain.   Skin:  No rash or lesions.  GU:  No dysuria, change in color of urine, no urgency or frequency.  No flank pain.  Musculoskeletal: No joint pain or swelling.  No decreased range of motion.  No back pain.  Endocrine: No heat intolerance,  no cold intolerance, no polyuria, no polydipsia  Psych: No change in mood or affect. No depression or anxiety.  No memory loss.   ALLERGIES:  No Known Allergies  PAST MEDICAL HISTORY: Past Medical History:  Diagnosis Date  . Patient denies medical problems     PAST SURGICAL HISTORY: Past Surgical History:  Procedure Laterality Date  . denies any surgery      MEDICATIONS AT HOME: Prior to Admission medications   Medication Sig Start Date End Date Taking? Authorizing Provider  benzonatate (TESSALON) 100 MG capsule Take 1 capsule (100 mg total) by mouth 3 (three) times daily as needed for cough. 04/06/19   Auburn Bilberry, MD  enoxaparin (LOVENOX) 40 MG/0.4ML injection Inject 0.4 mLs (40 mg total) into the skin daily. 04/06/19   Auburn Bilberry, MD  guaiFENesin-dextromethorphan (ROBITUSSIN DM) 100-10 MG/5ML syrup Take 5 mLs by mouth every 4 (four) hours as needed for cough. 04/06/19   Auburn Bilberry, MD    FAMILY HISTORY: No family history of CAD   SOCIAL HISTORY:  reports that he has never smoked. He has never used smokeless tobacco. He reports current alcohol use. He reports previous drug use.  PHYSICAL EXAM: Blood pressure 120/71, pulse 76, temperature 100.1 F (37.8 C), temperature source Oral, resp. rate (!) 41, weight 78.7 kg, SpO2 99 %.  General appearance :Awake, alert, not in any distress.  Eyes:, pupils equally reactive to light and accomodation,no scleral icterus.Pink conjunctiva HEENT: Atraumatic and Normocephalic Neck: supple, no JVD.  Resp:Good air entry bilaterally, no added sounds  CVS:  S1 S2 regular, no murmurs.  GI: Bowel sounds present, Non tender and not distended with no gaurding, rigidity or rebound. Extremities: B/L Lower Ext shows no edema, both legs are warm to touch Neurology:  speech clear,Non focal, sensation is grossly intact. Psychiatric: Normal judgment and insight. Alert and oriented x 3. Normal mood. Musculoskeletal:gait appears to be  normal.No digital cyanosis Skin:No Rash, warm and dry Wounds:N/A  LABS ON ADMISSION:  I have personally reviewed following labs and imaging studies  CBC: Recent Labs  Lab 04/05/19 1132 04/06/19 0446  WBC 10.4 10.7*  NEUTROABS 9.0* 9.2*  HGB 15.1 13.7  HCT 44.9 41.0  MCV 89.6 89.7  PLT 204 223    Basic Metabolic Panel: Recent Labs  Lab 04/05/19 1132 04/06/19 0446  NA 134* 136  K 3.9 3.8  CL 97* 104  CO2 25 23  GLUCOSE 119* 111*  BUN 12 11  CREATININE 0.78 0.66  CALCIUM 8.7* 8.1*    GFR: Estimated Creatinine Clearance: 130.3 mL/min (by C-G formula based on SCr of 0.66 mg/dL).  Liver Function Tests: Recent Labs  Lab 04/05/19 1132 04/06/19 0446  AST 48* 39  ALT 60* 53*  ALKPHOS 46 43  BILITOT 0.9 0.7  PROT 8.2* 7.1  ALBUMIN 3.9 3.3*   No results for input(s): LIPASE, AMYLASE in the last 168 hours. No results for input(s): AMMONIA in the last 168 hours.  Coagulation Profile: No results for input(s): INR, PROTIME in the last 168 hours.  Cardiac Enzymes: Recent Labs  Lab 04/05/19 1132  TROPONINI <0.03    BNP (last 3 results) No results for input(s): PROBNP in the last 8760 hours.  HbA1C: No results for input(s): HGBA1C in the last 72 hours.  CBG: No results for input(s): GLUCAP in the last 168 hours.  Lipid Profile: No results for input(s): CHOL, HDL, LDLCALC, TRIG, CHOLHDL, LDLDIRECT in the last 72 hours.  Thyroid Function Tests: No results for input(s): TSH, T4TOTAL, FREET4, T3FREE, THYROIDAB in the last 72 hours.  Anemia Panel: Recent Labs    04/06/19 1039  FERRITIN 1,470*    Urine analysis:    Component Value Date/Time   COLORURINE STRAW (A) 10/26/2015 0136   APPEARANCEUR CLEAR (A) 10/26/2015 0136   LABSPEC 1.004 (L) 10/26/2015 0136   PHURINE 6.0 10/26/2015 0136   GLUCOSEU NEGATIVE 10/26/2015 0136   HGBUR NEGATIVE 10/26/2015 0136   BILIRUBINUR NEGATIVE 10/26/2015 0136   KETONESUR NEGATIVE 10/26/2015 0136   PROTEINUR NEGATIVE  10/26/2015 0136   NITRITE NEGATIVE 10/26/2015 0136   LEUKOCYTESUR NEGATIVE 10/26/2015 0136    Sepsis Labs: Lactic Acid, Venous    Component Value Date/Time   LATICACIDVEN 1.1 04/05/2019 1133     Microbiology: Recent Results (from the past 240 hour(s))  SARS Coronavirus 2 (CEPHEID- Performed in [facility] hospital lab), Hosp Order     Status: None   Collection Time: 04/05/19 11:32 AM  Result Value Ref Range Status   SARS Coronavirus 2 NEGATIVE NEGATIVE Final    Comment: (NOTE) If result is NEGATIVE SARS-CoV-2 target nucleic acids are NOT DETECTED. The SARS-CoV-2 RNA is generally detectable in upper and lower  respiratory specimens during the acute phase of infection. The lowest  concentration of SARS-CoV-2 viral copies this assay can detect is 250  copies / mL. A negative result does not preclude SARS-CoV-2 infection  and should not be used as the sole basis for treatment or other  patient management decisions.  A negative result may occur with  improper specimen collection /  handling, submission of specimen other  than nasopharyngeal swab, presence of viral mutation(s) within the  areas targeted by this assay, and inadequate number of viral copies  (<250 copies / mL). A negative result must be combined with clinical  observations, patient history, and epidemiological information. If result is POSITIVE SARS-CoV-2 target nucleic acids are DETECTED. The SARS-CoV-2 RNA is generally detectable in upper and lower  respiratory specimens dur ing the acute phase of infection.  Positive  results are indicative of active infection with SARS-CoV-2.  Clinical  correlation with patient history and other diagnostic information is  necessary to determine patient infection status.  Positive results do  not rule out bacterial infection or co-infection with other viruses. If result is PRESUMPTIVE POSTIVE SARS-CoV-2 nucleic acids MAY BE PRESENT.   A presumptive positive result was obtained  on the submitted specimen  and confirmed on repeat testing.  While 2019 novel coronavirus  (SARS-CoV-2) nucleic acids may be present in the submitted sample  additional confirmatory testing may be necessary for epidemiological  and / or clinical management purposes  to differentiate between  SARS-CoV-2 and other Sarbecovirus currently known to infect humans.  If clinically indicated additional testing with an alternate test  methodology [phone]) is advised. The SARS-CoV-2 RNA is generally  detectable in upper and lower respiratory sp ecimens during the acute  phase of infection. The expected result is Negative. Fact Sheet for Patients:  BoilerBrush.com.cy Fact Sheet for Healthcare Providers: https://pope.com/ This test is not yet approved or cleared by the [place] FDA and has been authorized for detection and/or diagnosis of SARS-CoV-2 by FDA under an Emergency Use Authorization (EUA).  This EUA will remain in effect (meaning this test can be used) for the duration of the COVID-19 declaration under Section 564(b)(1) of the Act, 21 U.S.C. section 360bbb-3(b)(1), unless the authorization is terminated or revoked sooner. Performed at [facility], [address] [place], [place] [address]   Blood culture (routine x 2)     Status: None (Preliminary result)   Collection Time: 04/05/19 11:32 AM  Result Value Ref Range Status   Specimen Description BLOOD RAC  Final   Special Requests   Final    BOTTLES DRAWN AEROBIC AND ANAEROBIC Blood Culture adequate volume   Culture   Final    NO GROWTH < 24 HOURS Performed at [facility], [address] [place], [place] [address]    Report Status PENDING  Incomplete  Blood culture (routine x 2)     Status: None (Preliminary result)   Collection Time: 04/05/19 11:33 AM  Result Value Ref Range Status   Specimen Description BLOOD LAC  Final   Special Requests   Final     BOTTLES DRAWN AEROBIC AND ANAEROBIC Blood Culture adequate volume   Culture   Final    NO GROWTH < 24 HOURS Performed at [facility], [address], [place], [place] [address]    Report Status PENDING  Incomplete  Respiratory Panel by PCR     Status: None   Collection Time: 04/06/19  4:55 AM  Result Value Ref Range Status   Adenovirus NOT DETECTED NOT DETECTED Final   Coronavirus 229E NOT DETECTED NOT DETECTED Final    Comment: (NOTE) The Coronavirus on the Respiratory Panel, DOES NOT test for the novel  Coronavirus (2019 nCoV)    Coronavirus HKU1 NOT DETECTED NOT DETECTED Final   Coronavirus NL63 NOT DETECTED NOT DETECTED Final   Coronavirus OC43 NOT DETECTED NOT DETECTED  Final   Metapneumovirus NOT DETECTED NOT DETECTED Final   Rhinovirus / Enterovirus NOT DETECTED NOT DETECTED Final   Influenza A NOT DETECTED NOT DETECTED Final   Influenza B NOT DETECTED NOT DETECTED Final   Parainfluenza Virus 1 NOT DETECTED NOT DETECTED Final   Parainfluenza Virus 2 NOT DETECTED NOT DETECTED Final   Parainfluenza Virus 3 NOT DETECTED NOT DETECTED Final   Parainfluenza Virus 4 NOT DETECTED NOT DETECTED Final   Respiratory Syncytial Virus NOT DETECTED NOT DETECTED Final   Bordetella pertussis NOT DETECTED NOT DETECTED Final   Chlamydophila pneumoniae NOT DETECTED NOT DETECTED Final   Mycoplasma pneumoniae NOT DETECTED NOT DETECTED Final    Comment: Performed at [facility], [address], [place], [place] [address]  SARS Coronavirus 2 [facility] order, Performed in [facility] hospital lab)     Status: Abnormal   Collection Time: 04/06/19  4:55 AM  Result Value Ref Range Status   SARS Coronavirus 2 POSITIVE (A) NEGATIVE Final    Comment: CRITICAL RESULT CALLED TO, READ BACK BY AND VERIFIED WITH: YESSICA SORIANO  04/06/19 AKT (NOTE) If result is NEGATIVE SARS-CoV-2 target nucleic acids are NOT DETECTED. The SARS-CoV-2 RNA is generally detectable in upper and lower   respiratory specimens during the acute phase of infection. The lowest  concentration of SARS-CoV-2 viral copies this assay can detect is 250  copies / mL. A negative result does not preclude SARS-CoV-2 infection  and should not be used as the sole basis for treatment or other  patient management decisions.  A negative result may occur with  improper specimen collection / handling, submission of specimen other  than nasopharyngeal swab, presence of viral mutation(s) within the  areas targeted by this assay, and inadequate number of viral copies  (<250 copies / mL). A negative result must be combined with clinical  observations, patient history, and epidemiological information. If result is POSITIVE SARS-CoV-2 target nucleic acids are DETEC TED. The SARS-CoV-2 RNA is generally detectable in upper and lower  respiratory specimens during the acute phase of infection.  Positive  results are indicative of active infection with SARS-CoV-2.  Clinical  correlation with patient history and other diagnostic information is  necessary to determine patient infection status.  Positive results do  not rule out bacterial infection or co-infection with other viruses. If result is PRESUMPTIVE POSTIVE SARS-CoV-2 nucleic acids MAY BE PRESENT.   A presumptive positive result was obtained on the submitted specimen  and confirmed on repeat testing.  While 2019 novel coronavirus  (SARS-CoV-2) nucleic acids may be present in the submitted sample  additional confirmatory testing may be necessary for epidemiological  and / or clinical management purposes  to differentiate between  SARS-CoV-2 and other Sarbecovirus currently known to infect humans.  If clinically indicated additional testing with an alternate test  methodology ([id number] [id number]) is advised. The SARS-CoV-2 RNA is generally  detectable in upper and lower respiratory specimens during the acute  phase of infection. The expected result is Negative. Fact  Sheet for Patients:  BoilerBrush.com.cy Fact Sheet for Healthcare Providers: https://pope.com/ This test is not yet approved or cleared by the [place] FDA and has been authorized for detection and/or diagnosis of SARS-CoV-2 by FDA under an Emergency Use Authorization (EUA).  This EUA will remain in effect (meaning this test can be used) for the duration of the COVID-19 declaration under Section 564(b)(1) of the Act, 21 U.S.C. section 360bbb-3(b)(1), unless the authorization is terminated or revoked sooner. Performed  at [facility], [address] [place], [place] [address]       RADIOLOGIC STUDIES ON ADMISSION: Dg Chest Portable 1 View  Result Date: 04/05/2019 CLINICAL DATA:  Fever and shortness of breath EXAM: PORTABLE CHEST 1 VIEW COMPARISON:  Chest CT October 26, 2015 FINDINGS: There is airspace opacity in the left lower lobe. There is slight right base atelectasis. Lungs elsewhere clear. Heart size and pulmonary vascularity are normal. No adenopathy. No bone lesions. IMPRESSION: Left lower lobe infiltrate consistent with pneumonia. Slight right base atelectasis. Heart size within normal limits. No evident adenopathy. Electronically Signed   By: Bretta Bang III M.D.   On: 04/05/2019 11:41    I have personally reviewed images of chest xray -bilateral patchy infiltrates  EKG:  Personally reviewed.  Sinus rhythm  ASSESSMENT AND PLAN: Acute hypoxemic respiratory failure secondary to COVID-19 viral pneumonia: Although hypoxic-appears to be mild-and stable on 2-3 L of oxygen.  Although inflammatory markers are high-clinically he appears well.  Hence for now-we will hold off on steroid/Actemra/Alimta severe.  We will follow his clinical course-and reassess tomorrow.   Further plan will depend as patient's clinical course evolves and further radiologic and laboratory data become available. Patient will be monitored  closely.  Above noted plan was discussed with patient face to face at bedside, he was in agreement.   CONSULTS: None  DVT Prophylaxis: Prophylactic Lovenox  Code Status: Full Code  Disposition Plan:  Discharge back home possibly in 2-3 days, depending on clinical course  Admission status: Inpatient going to medical floor   Total time spent  55 minutes.Greater than 50% of this time was spent in counseling, explanation of diagnosis, planning of further management, and coordination of care.  Severity of illness: The appropriate patient status for this patient is INPATIENT. Inpatient status is judged to be reasonable and necessary in order to provide the required intensity of service to ensure the patient's safety. The patient's presenting symptoms, physical exam findings, and initial radiographic and laboratory data in the context of their chronic comorbidities is felt to place them at high risk for further clinical deterioration. Furthermore, it is not anticipated that the patient will be medically stable for discharge from the hospital within 2 midnights of admission. The following factors support the patient status of inpatient.   " The patient's presenting symptoms include fever, shortness of breath " The worrisome physical exam findings include bibasilar rales, tachypnea, hypoxemia " The initial radiographic and laboratory data are worrisome because of bilateral infiltrates  * I certify that at the point of admission it is my clinical judgment that the patient will require inpatient hospital care spanning beyond 2 midnights from the point of admission due to high intensity of service, high risk for further deterioration and high frequency of surveillance required.  Jeoffrey Massed [facility] Pager [phone]  If 7PM-7AM, please contact night-coverage  Please page via [email or web address]  Go to [email or web address] and use [facility] universal password to access. If you do not have  the password, please contact the hospital operator.  Locate the [facility] provider you are looking for under [facility] and page to a number that you can be directly reached. If you still have difficulty reaching the provider, please page the [facility] (Director on Call) for the Hospitalists listed on amion for assistance.  04/06/2019, 12:59 PM

## 2019-04-06 NOTE — Progress Notes (Addendum)
MD Mayo made aware that pt's covid test from this morning is positive, awaiting for MD's response.   [id number] Per MD Mayo, she will let Dr. Allena Katz know, "so that he can work on transfer" no new orders received. Charge Nurse and [facility] also notified.

## 2019-04-06 NOTE — Discharge Summary (Signed)
[facility] [place] [facility] Gerald Santiago, 28 y.o., DOB 06/22/1991, MRN [id number]. Admission date: 04/05/2019 Discharge Date 04/06/2019 Primary MD Patient, No Pcp Per Admitting Physician Auburn Bilberry, MD  Admission Diagnosis  Respiratory distress [R06.03] Community acquired pneumonia, unspecified laterality [J18.9]  Discharge Diagnosis   Active Problems: Shortness of breath due to COVID-19 Slightly abnormal LFTs     Hospital Course Patient is 28 year old healthy Hispanic male who presented to our hospital with shortness of breath cough and fever.  Initial evaluation in the emergency room showed possible community-acquired pneumonia.  His COVID-19 test was initially negative however his presentation was consistent with COVID-19.   Patient's COVID-19 test was rechecked this morning and became positive.         Consults  None  Significant Tests:  See full reports for all details      Dg Chest Portable 1 View  Result Date: 04/05/2019 CLINICAL DATA:  Fever and shortness of breath EXAM: PORTABLE CHEST 1 VIEW COMPARISON:  Chest CT October 26, 2015 FINDINGS: There is airspace opacity in the left lower lobe. There is slight right base atelectasis. Lungs elsewhere clear. Heart size and pulmonary vascularity are normal. No adenopathy. No bone lesions. IMPRESSION: Left lower lobe infiltrate consistent with pneumonia. Slight right base atelectasis. Heart size within normal limits. No evident adenopathy. Electronically Signed   By: Bretta Bang III M.D.   On: 04/05/2019 11:41       Today   Subjective:   Gerald Santiago patient complains of shortness of breath headache  Objective:   Blood pressure (!) 126/59, pulse 72, temperature 99.6 F (37.6 C), resp. rate (!) 23, height  (1.626 m), weight 73.5 kg, SpO2 97 %.  .  Intake/Output Summary (Last 24 hours) at 04/06/2019 1001 Last data filed at 04/06/2019 0824 Gross per 24 hour   Intake 1512.08 ml  Output 450 ml  Net 1062.08 ml    Exam VITAL SIGNS: Blood pressure (!) 126/59, pulse 72, temperature 99.6 F (37.6 C), resp. rate (!) 23, height  (1.626 m), weight 73.5 kg, SpO2 97 %.  GENERAL:  28 y.o.-year-old patient lying in the bed with no acute distress.  EYES: Pupils equal, round, reactive to light and accommodation. No scleral icterus. Extraocular muscles intact.  HEENT: Head atraumatic, normocephalic. Oropharynx and nasopharynx clear.  NECK:  Supple, no jugular venous distention. No thyroid enlargement, no tenderness.  LUNGS: Decreased breath sounds without any accessory muscle usage  cARDIOVASCULAR: S1, S2 normal. No murmurs, rubs, or gallops.  ABDOMEN: Soft, nontender, nondistended. Bowel sounds present. No organomegaly or mass.  EXTREMITIES: No pedal edema, cyanosis, or clubbing.  NEUROLOGIC: Cranial nerves II through XII are intact. Muscle strength 5/5 in all extremities. Sensation intact. Gait not checked.  PSYCHIATRIC: The patient is alert and oriented x 3.  SKIN: No obvious rash, lesion, or ulcer.   Data Review     CBC w Diff:  Lab Results  Component Value Date   WBC 10.7 (H) 04/06/2019   HGB 13.7 04/06/2019   HCT 41.0 04/06/2019   PLT 223 04/06/2019   LYMPHOPCT 7 04/06/2019   MONOPCT 5 04/06/2019   EOSPCT 0 04/06/2019   BASOPCT 0 04/06/2019   CMP:  Lab Results  Component Value Date   NA 136 04/06/2019   K 3.8 04/06/2019   CL 104 04/06/2019   CO2 23 04/06/2019   BUN 11 04/06/2019   CREATININE 0.66 04/06/2019   PROT 7.1 04/06/2019  ALBUMIN 3.3 (L) 04/06/2019   BILITOT 0.7 04/06/2019   ALKPHOS 43 04/06/2019   AST 39 04/06/2019   ALT 53 (H) 04/06/2019  .  Micro Results Recent Results (from the past 240 hour(s))  SARS Coronavirus 2 (CEPHEID- Performed in [id number] [facility] hospital lab), Hosp Order     Status: None   Collection Time: 04/05/19 11:32 AM  Result Value Ref Range Status   SARS Coronavirus 2 NEGATIVE NEGATIVE Final     Comment: (NOTE) If result is NEGATIVE SARS-CoV-2 target nucleic acids are NOT DETECTED. The SARS-CoV-2 RNA is generally detectable in upper and lower  respiratory specimens during the acute phase of infection. The lowest  concentration of SARS-CoV-2 viral copies this assay can detect is 250  copies / mL. A negative result does not preclude SARS-CoV-2 infection  and should not be used as the sole basis for treatment or other  patient management decisions.  A negative result may occur with  improper specimen collection / handling, submission of specimen other  than nasopharyngeal swab, presence of viral mutation(s) within the  areas targeted by this assay, and inadequate number of viral copies  (<250 copies / mL). A negative result must be combined with clinical  observations, patient history, and epidemiological information. If result is POSITIVE SARS-CoV-2 target nucleic acids are DETECTED. The SARS-CoV-2 RNA is generally detectable in upper and lower  respiratory specimens dur ing the acute phase of infection.  Positive  results are indicative of active infection with SARS-CoV-2.  Clinical  correlation with patient history and other diagnostic information is  necessary to determine patient infection status.  Positive results do  not rule out bacterial infection or co-infection with other viruses. If result is PRESUMPTIVE POSTIVE SARS-CoV-2 nucleic acids MAY BE PRESENT.   A presumptive positive result was obtained on the submitted specimen  and confirmed on repeat testing.  While 2019 novel coronavirus  (SARS-CoV-2) nucleic acids may be present in the submitted sample  additional confirmatory testing may be necessary for epidemiological  and / or clinical management purposes  to differentiate between  SARS-CoV-2 and other Sarbecovirus currently known to infect humans.  If clinically indicated additional testing with an alternate test  methodology [phone]([id number]) is advised. The  SARS-CoV-2 RNA is generally  detectable in upper and lower respiratory sp ecimens during the acute  phase of infection. The expected result is Negative. Fact Sheet for Patients:  BoilerBrush.com.cyhttps://www.fda.gov/media/136312/download Fact Sheet for Healthcare Providers: https://pope.com/https://www.fda.gov/media/136313/download This test is not yet approved or cleared by the [place] FDA and has been authorized for detection and/or diagnosis of SARS-CoV-2 by FDA under an Emergency Use Authorization (EUA).  This EUA will remain in effect (meaning this test can be used) for the duration of the COVID-19 declaration under Section 564(b)(1) of the Act, 21 U.S.C. section 360bbb-3(b)(1), unless the authorization is terminated or revoked sooner. Performed at [facility], [address] [place], [place] [address]   Blood culture (routine x 2)     Status: None (Preliminary result)   Collection Time: 04/05/19 11:32 AM  Result Value Ref Range Status   Specimen Description BLOOD RAC  Final   Special Requests   Final    BOTTLES DRAWN AEROBIC AND ANAEROBIC Blood Culture adequate volume   Culture   Final    NO GROWTH < 24 HOURS Performed at [facility], [address] [place], [place] [address]    Report Status PENDING  Incomplete  Blood culture (routine x 2)  Status: None (Preliminary result)   Collection Time: 04/05/19 11:33 AM  Result Value Ref Range Status   Specimen Description BLOOD LAC  Final   Special Requests   Final    BOTTLES DRAWN AEROBIC AND ANAEROBIC Blood Culture adequate volume   Culture   Final    NO GROWTH < 24 HOURS Performed at [facility], [address] [place], [place] [address]    Report Status PENDING  Incomplete  Respiratory Panel by PCR     Status: None   Collection Time: 04/06/19  4:55 AM  Result Value Ref Range Status   Adenovirus NOT DETECTED NOT DETECTED Final   Coronavirus 229E NOT DETECTED NOT DETECTED Final    Comment: (NOTE) The  Coronavirus on the Respiratory Panel, DOES NOT test for the novel  Coronavirus (2019 nCoV)    Coronavirus HKU1 NOT DETECTED NOT DETECTED Final   Coronavirus NL63 NOT DETECTED NOT DETECTED Final   Coronavirus OC43 NOT DETECTED NOT DETECTED Final   Metapneumovirus NOT DETECTED NOT DETECTED Final   Rhinovirus / Enterovirus NOT DETECTED NOT DETECTED Final   Influenza A NOT DETECTED NOT DETECTED Final   Influenza B NOT DETECTED NOT DETECTED Final   Parainfluenza Virus 1 NOT DETECTED NOT DETECTED Final   Parainfluenza Virus 2 NOT DETECTED NOT DETECTED Final   Parainfluenza Virus 3 NOT DETECTED NOT DETECTED Final   Parainfluenza Virus 4 NOT DETECTED NOT DETECTED Final   Respiratory Syncytial Virus NOT DETECTED NOT DETECTED Final   Bordetella pertussis NOT DETECTED NOT DETECTED Final   Chlamydophila pneumoniae NOT DETECTED NOT DETECTED Final   Mycoplasma pneumoniae NOT DETECTED NOT DETECTED Final    Comment: Performed at [facility], [address], [place], [place] [address]  SARS Coronavirus 2 [facility] order, Performed in [facility] hospital lab)     Status: Abnormal   Collection Time: 04/06/19  4:55 AM  Result Value Ref Range Status   SARS Coronavirus 2 POSITIVE (A) NEGATIVE Final    Comment: CRITICAL RESULT CALLED TO, READ BACK BY AND VERIFIED WITH: YESSICA SORIANO  04/06/19 AKT (NOTE) If result is NEGATIVE SARS-CoV-2 target nucleic acids are NOT DETECTED. The SARS-CoV-2 RNA is generally detectable in upper and lower  respiratory specimens during the acute phase of infection. The lowest  concentration of SARS-CoV-2 viral copies this assay can detect is 250  copies / mL. A negative result does not preclude SARS-CoV-2 infection  and should not be used as the sole basis for treatment or other  patient management decisions.  A negative result may occur with  improper specimen collection / handling, submission of specimen other  than nasopharyngeal swab, presence of viral  mutation(s) within the  areas targeted by this assay, and inadequate number of viral copies  (<250 copies / mL). A negative result must be combined with clinical  observations, patient history, and epidemiological information. If result is POSITIVE SARS-CoV-2 target nucleic acids are DETEC TED. The SARS-CoV-2 RNA is generally detectable in upper and lower  respiratory specimens during the acute phase of infection.  Positive  results are indicative of active infection with SARS-CoV-2.  Clinical  correlation with patient history and other diagnostic information is  necessary to determine patient infection status.  Positive results do  not rule out bacterial infection or co-infection with other viruses. If result is PRESUMPTIVE POSTIVE SARS-CoV-2 nucleic acids MAY BE PRESENT.   A presumptive positive result was obtained on the submitted specimen  and confirmed on repeat testing.  While 2019 novel coronavirus  (SARS-CoV-2) nucleic acids may be present in the submitted sample  additional confirmatory testing may be necessary for epidemiological  and / or clinical management purposes  to differentiate between  SARS-CoV-2 and other Sarbecovirus currently known to infect humans.  If clinically indicated additional testing with an alternate test  methodology ([id number]) is advised. The SARS-CoV-2 RNA is generally  detectable in upper and lower respiratory specimens during the acute  phase of infection. The expected result is Negative. Fact Sheet for Patients:  BoilerBrush.com.cy Fact Sheet for Healthcare Providers: https://pope.com/ This test is not yet approved or cleared by the [place] FDA and has been authorized for detection and/or diagnosis of SARS-CoV-2 by FDA under an Emergency Use Authorization (EUA).  This EUA will remain in effect (meaning this test can be used) for the duration of the COVID-19 declaration under Section 564(b)(1)  of the Act, 21 U.S.C. section 360bbb-3(b)(1), unless the authorization is terminated or revoked sooner. Performed at [facility], [address] [place], [place] [address]         Code Status Orders  (From admission, onward)         Start     Ordered   04/05/19 1649  Full code  Continuous     04/05/19 1649        Code Status History    This patient has a current code status but no historical code status.            Discharge Medications   Allergies as of 04/06/2019   No Known Allergies     Medication List    STOP taking these medications   ibuprofen 200 MG tablet Commonly known as:  ADVIL     TAKE these medications   benzonatate 100 MG capsule Commonly known as:  TESSALON Take 1 capsule (100 mg total) by mouth 3 (three) times daily as needed for cough.   enoxaparin 40 MG/0.4ML injection Commonly known as:  LOVENOX Inject 0.4 mLs (40 mg total) into the skin daily.   guaiFENesin-dextromethorphan 100-10 MG/5ML syrup Commonly known as:  ROBITUSSIN DM Take 5 mLs by mouth every 4 (four) hours as needed for cough.          Total Time in preparing paper work, data evaluation and todays exam - 35 minutes  Auburn Bilberry M.D on 04/06/2019 at 10:01 AM [facility]   Office  [phone]

## 2019-04-06 NOTE — Progress Notes (Signed)
Utilized [employer] Interpreter Cammy Copa [phone] to have a 3 way conversation with the patient's mother. Questions encouraged and answered.

## 2019-04-06 NOTE — Progress Notes (Signed)
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 Pt has been febrile on shift, treated with Tylenol with minimum help, MD [facility] made aware, no new orders received. Staff will continue to assess.

## 2019-04-06 NOTE — Care Management (Signed)
Case manager will continue to monitor patient for appropriate discharge plan as he medically improves.May God Bless Him to do so. Arrived today from [facility]. Patient is uninsured and will need F/U appointment arranged.    Vance Peper, RN Case Manager [phone]

## 2019-04-06 NOTE — Progress Notes (Signed)
Dr Windell Norfolk Informed of patient arrival to room 137 via text page

## 2019-04-06 NOTE — Progress Notes (Signed)
Report called to Counselling psychologist at [facility].  Elevated Temperature noted and medicated for fever , EMS on way to transport

## 2019-04-07 LAB — CBC WITH DIFFERENTIAL/PLATELET
Abs Immature Granulocytes: 0.2 10*3/uL — ABNORMAL HIGH (ref 0.00–0.07)
Basophils Absolute: 0.1 10*3/uL (ref 0.0–0.1)
Basophils Relative: 1 %
Eosinophils Absolute: 0.1 10*3/uL (ref 0.0–0.5)
Eosinophils Relative: 2 %
HCT: 46.4 % (ref 39.0–52.0)
Hemoglobin: 14.9 g/dL (ref 13.0–17.0)
Immature Granulocytes: 4 %
Lymphocytes Relative: 26 %
Lymphs Abs: 1.2 10*3/uL (ref 0.7–4.0)
MCH: 29.9 pg (ref 26.0–34.0)
MCHC: 32.1 g/dL (ref 30.0–36.0)
MCV: 93 fL (ref 80.0–100.0)
Monocytes Absolute: 0.5 10*3/uL (ref 0.1–1.0)
Monocytes Relative: 11 %
Neutro Abs: 2.6 10*3/uL (ref 1.7–7.7)
Neutrophils Relative %: 56 %
Platelets: 214 10*3/uL (ref 150–400)
RBC: 4.99 MIL/uL (ref 4.22–5.81)
RDW: 12.6 % (ref 11.5–15.5)
WBC: 4.8 10*3/uL (ref 4.0–10.5)
nRBC: 0 % (ref 0.0–0.2)

## 2019-04-07 LAB — ABO/RH: ABO/RH(D): O POS

## 2019-04-07 LAB — COMPREHENSIVE METABOLIC PANEL
ALT: 53 U/L — ABNORMAL HIGH (ref 0–44)
AST: 33 U/L (ref 15–41)
Albumin: 3.2 g/dL — ABNORMAL LOW (ref 3.5–5.0)
Alkaline Phosphatase: 48 U/L (ref 38–126)
Anion gap: 11 (ref 5–15)
BUN: 11 mg/dL (ref 6–20)
CO2: 24 mmol/L (ref 22–32)
Calcium: 8.8 mg/dL — ABNORMAL LOW (ref 8.9–10.3)
Chloride: 103 mmol/L (ref 98–111)
Creatinine, Ser: 0.59 mg/dL — ABNORMAL LOW (ref 0.61–1.24)
GFR calc Af Amer: >60 mL/min (ref >60)
GFR calc non Af Amer: >60 mL/min (ref >60)
Glucose, Bld: 85 mg/dL (ref 70–99)
Potassium: 4 mmol/L (ref 3.5–5.1)
Sodium: 138 mmol/L (ref 135–145)
Total Bilirubin: 0.7 mg/dL (ref 0.3–1.2)
Total Protein: 7.2 g/dL (ref 6.5–8.1)

## 2019-04-07 LAB — FERRITIN: Ferritin: 1742 ng/mL — ABNORMAL HIGH (ref 24–336)

## 2019-04-07 LAB — D-DIMER, QUANTITATIVE: D-Dimer, Quant: 0.88 ug/mL-FEU — ABNORMAL HIGH (ref 0.00–0.50)

## 2019-04-07 LAB — C-REACTIVE PROTEIN: CRP: 17.8 mg/dL — ABNORMAL HIGH (ref <1.0)

## 2019-04-07 MED ORDER — OPTICHAMBER DIAMOND MISC
1.0000 | Freq: Once | Status: DC
  Filled 2019-04-07: qty 1

## 2019-04-07 NOTE — Progress Notes (Signed)
CardioVascular Research Department and AHF Team  ReDS Research Project   Patient #: [id number]  ReDS Measurement  Right: 29 %  Left: low quality x 3

## 2019-04-07 NOTE — Progress Notes (Signed)
Patient has rested well throughout the shift. He removed the O2 tubing and maintained sats 94-98%. Afebrile this shift. UOP WNL. No C/O pain or discomfort.

## 2019-04-07 NOTE — Plan of Care (Signed)
  Problem: Education: Goal: Knowledge of risk factors and measures for prevention of condition will improve Outcome: Progressing   Problem: Respiratory: Goal: Will maintain a patent airway Outcome: Progressing Goal: Complications related to the disease process, condition or treatment will be avoided or minimized Outcome: Progressing   Problem: Education: Goal: Knowledge of General Education information will improve Description: Including pain rating scale, medication(s)/side effects and non-pharmacologic comfort measures Outcome: Progressing   Problem: Health Behavior/Discharge Planning: Goal: Ability to manage health-related needs will improve Outcome: Progressing   Problem: Clinical Measurements: Goal: Ability to maintain clinical measurements within normal limits will improve Outcome: Progressing Goal: Will remain free from infection Outcome: Progressing Goal: Diagnostic test results will improve Outcome: Progressing Goal: Respiratory complications will improve Outcome: Progressing Goal: Cardiovascular complication will be avoided Outcome: Progressing   Problem: Activity: Goal: Risk for activity intolerance will decrease Outcome: Progressing   Problem: Nutrition: Goal: Adequate nutrition will be maintained Outcome: Progressing   Problem: Coping: Goal: Level of anxiety will decrease Outcome: Progressing   Problem: Elimination: Goal: Will not experience complications related to bowel motility Outcome: Progressing Goal: Will not experience complications related to urinary retention Outcome: Progressing   Problem: Pain Managment: Goal: General experience of comfort will improve Outcome: Progressing   Problem: Safety: Goal: Ability to remain free from injury will improve Outcome: Progressing   Problem: Skin Integrity: Goal: Risk for impaired skin integrity will decrease Outcome: Progressing   

## 2019-04-07 NOTE — Progress Notes (Addendum)
PROGRESS NOTE                                                                                                                                                                                                             Patient Demographics:    Gerald Santiago, is a 28 y.o. male, DOB - 02-21-1991, RUE:[id number]  Outpatient Primary MD for the patient is Patient, No Pcp Per    LOS - 1  No chief complaint on file.      Brief Narrative: Patient is a 28 y.o. male with no significant PMHx presented with cough and shortness of breath-found to have mild acute hypoxic respiratory failure in setting of COVID-19 viral pneumonia.  See below for further details   Subjective:    [name] today feels better-less cough-claims he has ambulated a bit more compared to yesterday.  RN staff attempting to titrate off oxygen.   Assessment  & Plan :   Acute hypoxemic respiratory failure secondary to COVID 19 viral pneumonia: Improving-just supportive care-continue at attempts to slowly titrate off oxygen to room air today.  Although inflammatory markers have worsened (especially CRP) however clinically the patient appears to have improved.  Continue monitoring for another day or so-since clinically improving-do not think we need to initiate any additional therapy including steroids at this point.  Suspect if titrate off to room air today-and patient continues to improve-he could potentially go home tomorrow morning.   COVID-19 Labs:  Recent Labs    04/06/19 1039 04/07/19 0500  DDIMER  --  0.88*  FERRITIN 1,470* 1,742*  CRP 15.8* 17.8*    Lab Results  Component Value Date   SARSCOV2NAA POSITIVE (A) 04/06/2019   SARSCOV2NAA NEGATIVE 04/05/2019     COVID-19 Medications: None  Vent Settings:    ABG: No results found for: PHART, PCO2ART, PO2ART, HCO3, TCO2, ACIDBASEDEF, O2SAT  Condition - Extremely Guarded   Family Communication  : None at bedside  Code Status :  Full Code  Diet : Regular diet  Disposition Plan  :  Remain inpatient-home most likely tomorrow if clinical improvement continues and patient has been titrated off to room air.  Consults  : None  GI prophylaxis:None  DVT Prophylaxis  :  Lovenox  Lab Results  Component Value Date   PLT 214 04/07/2019    Inpatient  Medications  Scheduled Meds: . albuterol  2 puff Inhalation Q6H  . enoxaparin (LOVENOX) injection  40 mg Subcutaneous Q24H  . optichamber diamond  1 each Other Once  . sodium chloride flush  3 mL Intravenous Q12H   Continuous Infusions: . sodium chloride     PRN Meds:.sodium chloride, acetaminophen, chlorpheniramine-HYDROcodone, guaiFENesin-dextromethorphan, ondansetron **OR** ondansetron (ZOFRAN) IV, polyethylene glycol, sodium chloride flush  Antibiotics  :    Anti-infectives (From admission, onward)   None       Time Spent in minutes  25   Jeoffrey Massed M.D on 04/07/2019 at 12:13 PM  To page go to [email or web address] - use universal password  [facility] -  Office  [phone]    Admit date - 04/06/2019    1    Objective:   Vitals:   04/06/19 1817 04/06/19 2216 04/06/19 2356 04/07/19 0300  BP:  121/75 112/66 103/71  Pulse:  69 64 60  Resp:  (!) 22 18 (!) 25  Temp: 99.8 F (37.7 C) 98.3 F (36.8 C) 98.7 F (37.1 C) 98.4 F (36.9 C)  TempSrc: Oral Oral Oral Oral  SpO2:  99% 100% 94%  Weight:      Height:        Wt Readings from Last 3 Encounters:  04/06/19 78.7 kg  04/05/19 73.5 kg  10/26/15 77.1 kg     Intake/Output Summary (Last 24 hours) at 04/07/2019 1213 Last data filed at 04/07/2019 0300 Gross per 24 hour  Intake -  Output 925 ml  Net -925 ml     Physical Exam Gen Exam:IAlert awake-not in any distress HEENT:atraumatic, normocephalic Chest: B/L clear to auscultation anteriorly CVS:S1S2 regular Abdomen:soft non tender, non distended Extremities:no edema  Neurology: Nonfocal Skin: no rash   Data Review:    CBC Recent Labs  Lab 04/05/19 1132 04/06/19 0446 04/07/19 0500  WBC 10.4 10.7* 4.8  HGB 15.1 13.7 14.9  HCT 44.9 41.0 46.4  PLT 204 223 214  MCV 89.6 89.7 93.0  MCH 30.1 30.0 29.9  MCHC 33.6 33.4 32.1  RDW 12.5 12.5 12.6  LYMPHSABS 0.7 0.7 1.2  MONOABS 0.6 0.6 0.5  EOSABS 0.0 0.0 0.1  BASOSABS 0.0 0.0 0.1    Chemistries  Recent Labs  Lab 04/05/19 1132 04/06/19 0446 04/07/19 0500  NA 134* 136 138  K 3.9 3.8 4.0  CL 97* 104 103  CO2 GLUCOSE 119* 111* 85  BUN CREATININE 0.78 0.66 0.59*  CALCIUM 8.7* 8.1* 8.8*  AST 48* 39 33  ALT 60* 53* 53*  ALKPHOS 46 43 48  BILITOT 0.9 0.7 0.7   ------------------------------------------------------------------------------------------------------------------ No results for input(s): CHOL, HDL, LDLCALC, TRIG, CHOLHDL, LDLDIRECT in the last 72 hours.  No results found for: HGBA1C ------------------------------------------------------------------------------------------------------------------ No results for input(s): TSH, T4TOTAL, T3FREE, THYROIDAB in the last 72 hours.  Invalid input(s): FREET3 ------------------------------------------------------------------------------------------------------------------ Recent Labs    04/06/19 1039 04/07/19 0500  FERRITIN 1,470* 1,742*    Coagulation profile No results for input(s): INR, PROTIME in the last 168 hours.  Recent Labs    04/07/19 0500  DDIMER 0.88*    Cardiac Enzymes Recent Labs  Lab 04/05/19 1132  TROPONINI <0.03   ------------------------------------------------------------------------------------------------------------------ No results found for: BNP  Micro Results Recent Results (from the past 240 hour(s))  SARS Coronavirus 2 (CEPHEID- Performed in [facility] hospital lab), Hosp Order     Status: None   Collection Time: 04/05/19 11:32 AM  Result Value Ref Range Status  SARS  Coronavirus 2 NEGATIVE NEGATIVE Final    Comment: (NOTE) If result is NEGATIVE SARS-CoV-2 target nucleic acids are NOT DETECTED. The SARS-CoV-2 RNA is generally detectable in upper and lower  respiratory specimens during the acute phase of infection. The lowest  concentration of SARS-CoV-2 viral copies this assay can detect is 250  copies / mL. A negative result does not preclude SARS-CoV-2 infection  and should not be used as the sole basis for treatment or other  patient management decisions.  A negative result may occur with  improper specimen collection / handling, submission of specimen other  than nasopharyngeal swab, presence of viral mutation(s) within the  areas targeted by this assay, and inadequate number of viral copies  (<250 copies / mL). A negative result must be combined with clinical  observations, patient history, and epidemiological information. If result is POSITIVE SARS-CoV-2 target nucleic acids are DETECTED. The SARS-CoV-2 RNA is generally detectable in upper and lower  respiratory specimens dur ing the acute phase of infection.  Positive  results are indicative of active infection with SARS-CoV-2.  Clinical  correlation with patient history and other diagnostic information is  necessary to determine patient infection status.  Positive results do  not rule out bacterial infection or co-infection with other viruses. If result is PRESUMPTIVE POSTIVE SARS-CoV-2 nucleic acids MAY BE PRESENT.   A presumptive positive result was obtained on the submitted specimen  and confirmed on repeat testing.  While 2019 novel coronavirus  (SARS-CoV-2) nucleic acids may be present in the submitted sample  additional confirmatory testing may be necessary for epidemiological  and / or clinical management purposes  to differentiate between  SARS-CoV-2 and other Sarbecovirus currently known to infect humans.  If clinically indicated additional testing with an alternate test   methodology [id number]) is advised. The SARS-CoV-2 RNA is generally  detectable in upper and lower respiratory sp ecimens during the acute  phase of infection. The expected result is Negative. Fact Sheet for Patients:  BoilerBrush.com.cyhttps://www.fda.gov/media/136312/download Fact Sheet for Healthcare Providers: https://pope.com/https://www.fda.gov/media/136313/download This test is not yet approved or cleared by the [place] FDA and has been authorized for detection and/or diagnosis of SARS-CoV-2 by FDA under an Emergency Use Authorization (EUA).  This EUA will remain in effect (meaning this test can be used) for the duration of the COVID-19 declaration under Section 564(b)(1) of the Act, 21 U.S.C. section 360bbb-3(b)(1), unless the authorization is terminated or revoked sooner. Performed at [facility], [address] [place], [place] [address]   Blood culture (routine x 2)     Status: None (Preliminary result)   Collection Time: 04/05/19 11:32 AM  Result Value Ref Range Status   Specimen Description BLOOD RAC  Final   Special Requests   Final    BOTTLES DRAWN AEROBIC AND ANAEROBIC Blood Culture adequate volume   Culture   Final    NO GROWTH 2 DAYS Performed at [facility], [address] [place], [place] [address]    Report Status PENDING  Incomplete  Blood culture (routine x 2)     Status: None (Preliminary result)   Collection Time: 04/05/19 11:33 AM  Result Value Ref Range Status   Specimen Description BLOOD LAC  Final   Special Requests   Final    BOTTLES DRAWN AEROBIC AND ANAEROBIC Blood Culture adequate volume   Culture   Final    NO GROWTH 2 DAYS Performed at [facility], [address] [place], [place] [address]  Report Status PENDING  Incomplete  Respiratory Panel by PCR     Status: None   Collection Time: 04/06/19  4:55 AM  Result Value Ref Range Status   Adenovirus NOT DETECTED NOT DETECTED Final   Coronavirus 229E NOT DETECTED NOT DETECTED Final     Comment: (NOTE) The Coronavirus on the Respiratory Panel, DOES NOT test for the novel  Coronavirus (2019 nCoV)    Coronavirus HKU1 NOT DETECTED NOT DETECTED Final   Coronavirus NL63 NOT DETECTED NOT DETECTED Final   Coronavirus OC43 NOT DETECTED NOT DETECTED Final   Metapneumovirus NOT DETECTED NOT DETECTED Final   Rhinovirus / Enterovirus NOT DETECTED NOT DETECTED Final   Influenza A NOT DETECTED NOT DETECTED Final   Influenza B NOT DETECTED NOT DETECTED Final   Parainfluenza Virus 1 NOT DETECTED NOT DETECTED Final   Parainfluenza Virus 2 NOT DETECTED NOT DETECTED Final   Parainfluenza Virus 3 NOT DETECTED NOT DETECTED Final   Parainfluenza Virus 4 NOT DETECTED NOT DETECTED Final   Respiratory Syncytial Virus NOT DETECTED NOT DETECTED Final   Bordetella pertussis NOT DETECTED NOT DETECTED Final   Chlamydophila pneumoniae NOT DETECTED NOT DETECTED Final   Mycoplasma pneumoniae NOT DETECTED NOT DETECTED Final    Comment: Performed at [facility], [address], [place], [place] [address]  SARS Coronavirus 2 [facility] order, Performed in [facility] hospital lab)     Status: Abnormal   Collection Time: 04/06/19  4:55 AM  Result Value Ref Range Status   SARS Coronavirus 2 POSITIVE (A) NEGATIVE Final    Comment: CRITICAL RESULT CALLED TO, READ BACK BY AND VERIFIED WITH: YESSICA SORIANO  04/06/19 AKT (NOTE) If result is NEGATIVE SARS-CoV-2 target nucleic acids are NOT DETECTED. The SARS-CoV-2 RNA is generally detectable in upper and lower  respiratory specimens during the acute phase of infection. The lowest  concentration of SARS-CoV-2 viral copies this assay can detect is 250  copies / mL. A negative result does not preclude SARS-CoV-2 infection  and should not be used as the sole basis for treatment or other  patient management decisions.  A negative result may occur with  improper specimen collection / handling, submission of specimen other  than nasopharyngeal swab,  presence of viral mutation(s) within the  areas targeted by this assay, and inadequate number of viral copies  (<250 copies / mL). A negative result must be combined with clinical  observations, patient history, and epidemiological information. If result is POSITIVE SARS-CoV-2 target nucleic acids are DETEC TED. The SARS-CoV-2 RNA is generally detectable in upper and lower  respiratory specimens during the acute phase of infection.  Positive  results are indicative of active infection with SARS-CoV-2.  Clinical  correlation with patient history and other diagnostic information is  necessary to determine patient infection status.  Positive results do  not rule out bacterial infection or co-infection with other viruses. If result is PRESUMPTIVE POSTIVE SARS-CoV-2 nucleic acids MAY BE PRESENT.   A presumptive positive result was obtained on the submitted specimen  and confirmed on repeat testing.  While 2019 novel coronavirus  (SARS-CoV-2) nucleic acids may be present in the submitted sample  additional confirmatory testing may be necessary for epidemiological  and / or clinical management purposes  to differentiate between  SARS-CoV-2 and other Sarbecovirus currently known to infect humans.  If clinically indicated additional testing with an alternate test  methodology ([id number]) is advised. The SARS-CoV-2 RNA is generally  detectable in upper and lower respiratory  specimens during the acute  phase of infection. The expected result is Negative. Fact Sheet for Patients:  BoilerBrush.com.cy Fact Sheet for Healthcare Providers: https://pope.com/ This test is not yet approved or cleared by the [place] FDA and has been authorized for detection and/or diagnosis of SARS-CoV-2 by FDA under an Emergency Use Authorization (EUA).  This EUA will remain in effect (meaning this test can be used) for the duration of the COVID-19 declaration under  Section 564(b)(1) of the Act, 21 U.S.C. section 360bbb-3(b)(1), unless the authorization is terminated or revoked sooner. Performed at [facility], [address], [place], [place] [address]     Radiology Reports Portable Chest 1 View  Result Date: 04/06/2019 CLINICAL DATA:  Shortness of breath EXAM: PORTABLE CHEST 1 VIEW COMPARISON:  04/05/2019 FINDINGS: Low volume AP portable examination with new or increased heterogeneous opacity of the right lung base, concerning for additional developing infection or aspiration. Redemonstrated heterogeneous opacity of the left lung base is not significantly changed. The heart and mediastinum are normal. IMPRESSION: Low volume AP portable examination with new or increased heterogeneous opacity of the right lung base, concerning for additional developing infection or aspiration. Redemonstrated heterogeneous opacity of the left lung base is not significantly changed. The heart and mediastinum are normal. Electronically Signed   By: Lauralyn Primes M.D.   On: 04/06/2019 13:51   Dg Chest Portable 1 View  Result Date: 04/05/2019 CLINICAL DATA:  Fever and shortness of breath EXAM: PORTABLE CHEST 1 VIEW COMPARISON:  Chest CT October 26, 2015 FINDINGS: There is airspace opacity in the left lower lobe. There is slight right base atelectasis. Lungs elsewhere clear. Heart size and pulmonary vascularity are normal. No adenopathy. No bone lesions. IMPRESSION: Left lower lobe infiltrate consistent with pneumonia. Slight right base atelectasis. Heart size within normal limits. No evident adenopathy. Electronically Signed   By: Bretta Bang III M.D.   On: 04/05/2019 11:41

## 2019-04-07 NOTE — Progress Notes (Signed)
Utilized Spanish interpreter via [employer]. Explained that patient could ambulate around the room and to the restroom. Questions encouraged and answered.

## 2019-04-08 LAB — COMPREHENSIVE METABOLIC PANEL
ALT: 78 U/L — ABNORMAL HIGH (ref 0–44)
AST: 41 U/L (ref 15–41)
Albumin: 3.4 g/dL — ABNORMAL LOW (ref 3.5–5.0)
Alkaline Phosphatase: 55 U/L (ref 38–126)
Anion gap: 10 (ref 5–15)
BUN: 18 mg/dL (ref 6–20)
CO2: 25 mmol/L (ref 22–32)
Calcium: 9.3 mg/dL (ref 8.9–10.3)
Chloride: 105 mmol/L (ref 98–111)
Creatinine, Ser: 0.68 mg/dL (ref 0.61–1.24)
GFR calc Af Amer: >60 mL/min (ref >60)
GFR calc non Af Amer: >60 mL/min (ref >60)
Glucose, Bld: 92 mg/dL (ref 70–99)
Potassium: 3.8 mmol/L (ref 3.5–5.1)
Sodium: 140 mmol/L (ref 135–145)
Total Bilirubin: 0.4 mg/dL (ref 0.3–1.2)
Total Protein: 7.4 g/dL (ref 6.5–8.1)

## 2019-04-08 LAB — CBC WITH DIFFERENTIAL/PLATELET
Abs Immature Granulocytes: 0.21 10*3/uL — ABNORMAL HIGH (ref 0.00–0.07)
Basophils Absolute: 0.1 10*3/uL (ref 0.0–0.1)
Basophils Relative: 2 %
Eosinophils Absolute: 0.3 10*3/uL (ref 0.0–0.5)
Eosinophils Relative: 7 %
HCT: 47.7 % (ref 39.0–52.0)
Hemoglobin: 15.6 g/dL (ref 13.0–17.0)
Immature Granulocytes: 5 %
Lymphocytes Relative: 43 %
Lymphs Abs: 1.7 10*3/uL (ref 0.7–4.0)
MCH: 30.1 pg (ref 26.0–34.0)
MCHC: 32.7 g/dL (ref 30.0–36.0)
MCV: 92.1 fL (ref 80.0–100.0)
Monocytes Absolute: 0.5 10*3/uL (ref 0.1–1.0)
Monocytes Relative: 12 %
Neutro Abs: 1.2 10*3/uL — ABNORMAL LOW (ref 1.7–7.7)
Neutrophils Relative %: 31 %
Platelets: 294 10*3/uL (ref 150–400)
RBC: 5.18 MIL/uL (ref 4.22–5.81)
RDW: 12.7 % (ref 11.5–15.5)
WBC: 3.9 10*3/uL — ABNORMAL LOW (ref 4.0–10.5)
nRBC: 0 % (ref 0.0–0.2)

## 2019-04-08 LAB — D-DIMER, QUANTITATIVE: D-Dimer, Quant: 1.12 ug/mL-FEU — ABNORMAL HIGH (ref 0.00–0.50)

## 2019-04-08 LAB — C-REACTIVE PROTEIN: CRP: 8.9 mg/dL — ABNORMAL HIGH (ref <1.0)

## 2019-04-08 LAB — FERRITIN: Ferritin: 1678 ng/mL — ABNORMAL HIGH (ref 24–336)

## 2019-04-08 MED ORDER — BENZONATATE 100 MG PO CAPS: 100.0000 mg | ORAL_CAPSULE | Freq: Three times a day (TID) | ORAL | 0 refills | Status: AC | PRN

## 2019-04-08 MED ORDER — ALBUTEROL SULFATE HFA 108 (90 BASE) MCG/ACT IN AERS: 2.0000 | INHALATION_SPRAY | Freq: Four times a day (QID) | RESPIRATORY_TRACT | 0 refills | Status: AC | PRN

## 2019-04-08 NOTE — Progress Notes (Signed)
CardioVascular Research Department and AHF Team  ReDS Research Project   Patient #: [id number]  ReDS Measurement  Right: low quality x 3  Left: low quality x 3

## 2019-04-08 NOTE — Progress Notes (Signed)
Feels much better-on room air.  Claims he has ambulated in the room without much difficulty.  CRP downtrending without use of steroids or Actemra.  He has improved with just supportive care.  Will discharge home-patient instructed to seek immediate medical attention if in the unlikely event that he has recurrent fever, shortness of breath-he claims understanding.  See discharge summary for details.

## 2019-04-08 NOTE — Progress Notes (Addendum)
Patient given all discharge instructions in spanish with assistance of interpretor. Mask provided for patient and his Mother who is picking him up. PIV x1 removed and patient dressed himself.    Patient picked up by mother now, 1430 and plans to go home alone to quarantine.

## 2019-04-08 NOTE — Discharge Summary (Signed)
PATIENT DETAILS Name: Gerald Santiago Age: 28 y.o. Sex: male Date of Birth: August 31, 1991 MRN: [id number]. Admitting Physician: Maretta Bees, MD WUJ:[id number], No Pcp Per  Admit Date: 04/06/2019 Discharge date: 04/08/2019  Recommendations for Outpatient Follow-up:  1. Follow up with PCP in 1-2 weeks 2. Please obtain BMP/CBC in one week  Admitted From:  Home  Disposition: Home   Home Health: No  Equipment/Devices: None  Discharge Condition: Stable  CODE STATUS: FULL CODE  Diet recommendation:  Regular   Brief Summary: See H&P, Labs, Consult and Test reports for all details in brief, Patient is a 28 y.o. male with no significant PMHx presented with cough and shortness of breath-found to have mild acute hypoxic respiratory failure in setting of COVID-19 viral pneumonia.  See below for further details  Brief Hospital Course: Acute hypoxemic respiratory failure secondary to COVID 19 viral pneumonia:  Improved with just supportive care-inflammatory markers have come down spontaneously.  He never required steroids, Actemra or Remdesivir.  He is down to room air since yesterday-has ambulated in the the room without any difficulty.  He is anxious to go home-since he has improved spontaneously-and really looks good clinically-he is stable to go home.  We went over discharge instructions-if he develops persistent fever or redevelops shortness of breath-he is to seek immediate medical attention.    Procedures/Studies: None  Discharge Diagnoses:  Principal Problem:   COVID-19 virus infection Active Problems:   Acute respiratory failure with hypoxia [facility])  Discharge Instructions:  Activity:  As tolerated   Discharge Instructions    Call MD for:  difficulty breathing, headache or visual disturbances   Complete by:  As directed    Call MD for:  temperature >100.4   Complete by:  As directed    Diet general   Complete by:  As directed    Discharge instructions    Complete by:  As directed    Follow with Primary MD in 1 week  If you develop shortness of breath, fever-please seek immediate medical attention  Please get a complete blood count and chemistry panel checked by your Primary MD at your next visit, and again as instructed by your Primary MD.  Get Medicines reviewed and adjusted: Please take all your medications with you for your next visit with your Primary MD  Laboratory/radiological data: Please request your Primary MD to go over all hospital tests and procedure/radiological results at the follow up, please ask your Primary MD to get all Hospital records sent to his/her office.  In some cases, they will be blood work, cultures and biopsy results pending at the time of your discharge. Please request that your primary care M.D. follows up on these results.  Also Note the following: If you experience worsening of your admission symptoms, develop shortness of breath, life threatening emergency, suicidal or homicidal thoughts you must seek medical attention immediately by calling 911 or calling your MD immediately  if symptoms less severe.  You must read complete instructions/literature along with all the possible adverse reactions/side effects for all the Medicines you take and that have been prescribed to you. Take any new Medicines after you have completely understood and accpet all the possible adverse reactions/side effects.   Do not drive when taking Pain medications or sleeping medications (Benzodaizepines)  Do not take more than prescribed Pain, Sleep and Anxiety Medications. It is not advisable to combine anxiety,sleep and pain medications without talking with your primary care practitioner  Special Instructions: If you have  smoked or chewed Tobacco  in the last 2 yrs please stop smoking, stop any regular Alcohol  and or any Recreational drug use.  Wear Seat belts while driving.  Please note: You were cared for by a hospitalist  during your hospital stay. Once you are discharged, your primary care physician will handle any further medical issues. Please note that NO REFILLS for any discharge medications will be authorized once you are discharged, as it is imperative that you return to your primary care physician (or establish a relationship with a primary care physician if you do not have one) for your post hospital discharge needs so that they can reassess your need for medications and monitor your lab values.  ?   Person Under Monitoring Name: Gerald Santiago  Location: [address] [place] [address]   Infection Prevention Recommendations for Individuals Confirmed to have, or Being Evaluated for, 2019 Novel Coronavirus (COVID-19) Infection Who Receive Care at Home  Individuals who are confirmed to have, or are being evaluated for, COVID-19 should follow the prevention steps below until a healthcare provider or local or state health department says they can return to normal activities.  Stay home except to get medical care You should restrict activities outside your home, except for getting medical care. Do not go to work, school, or public areas, and do not use public transportation or taxis.  Call ahead before visiting your doctor Before your medical appointment, call the healthcare provider and tell them that you have, or are being evaluated for, COVID-19 infection. This will help the healthcare providers office take steps to keep other people from getting infected. Ask your healthcare provider to call the local or state health department.  Monitor your symptoms Seek prompt medical attention if your illness is worsening (e.g., difficulty breathing). Before going to your medical appointment, call the healthcare provider and tell them that you have, or are being evaluated for, COVID-19 infection. Ask your healthcare provider to call the local or state health department.  Wear a facemask You  should wear a facemask that covers your nose and mouth when you are in the same room with other people and when you visit a healthcare provider. People who live with or visit you should also wear a facemask while they are in the same room with you.  Separate yourself from other people in your home As much as possible, you should stay in a different room from other people in your home. Also, you should use a separate bathroom, if available.  Avoid sharing household items You should not share dishes, drinking glasses, cups, eating utensils, towels, bedding, or other items with other people in your home. After using these items, you should wash them thoroughly with soap and water.  Cover your coughs and sneezes Cover your mouth and nose with a tissue when you cough or sneeze, or you can cough or sneeze into your sleeve. Throw used tissues in a lined trash can, and immediately wash your hands with soap and water for at least 20 seconds or use an alcohol-based hand rub.  Wash your [employer] your hands often and thoroughly with soap and water for at least 20 seconds. You can use an alcohol-based hand sanitizer if soap and water are not available and if your hands are not visibly dirty. Avoid touching your eyes, nose, and mouth with unwashed hands.   Prevention Steps for Caregivers and Household Members of Individuals Confirmed to have, or Being Evaluated for, COVID-19 Infection  Being Cared for in the Home  If you live with, or provide care at home for, a person confirmed to have, or being evaluated for, COVID-19 infection please follow these guidelines to prevent infection:  Follow healthcare providers instructions Make sure that you understand and can help the patient follow any healthcare provider instructions for all care.  Provide for the patients basic needs You should help the patient with basic needs in the home and provide support for getting groceries, prescriptions, and other  personal needs.  Monitor the patients symptoms If they are getting sicker, call his or her medical provider and tell them that the patient has, or is being evaluated for, COVID-19 infection. This will help the healthcare providers office take steps to keep other people from getting infected. Ask the healthcare provider to call the local or state health department.  Limit the number of people who have contact with the patient If possible, have only one caregiver for the patient. Other household members should stay in another home or place of residence. If this is not possible, they should stay in another room, or be separated from the patient as much as possible. Use a separate bathroom, if available. Restrict visitors who do not have an essential need to be in the home.  Keep older adults, very young children, and other sick people away from the patient Keep older adults, very young children, and those who have compromised immune systems or chronic health conditions away from the patient. This includes people with chronic heart, lung, or kidney conditions, diabetes, and cancer.  Ensure good ventilation Make sure that shared spaces in the home have good air flow, such as from an air conditioner or an opened window, weather permitting.  Wash your hands often Wash your hands often and thoroughly with soap and water for at least 20 seconds. You can use an alcohol based hand sanitizer if soap and water are not available and if your hands are not visibly dirty. Avoid touching your eyes, nose, and mouth with unwashed hands. Use disposable paper towels to dry your hands. If not available, use dedicated cloth towels and replace them when they become wet.  Wear a facemask and gloves Wear a disposable facemask at all times in the room and gloves when you touch or have contact with the patients blood, body fluids, and/or secretions or excretions, such as sweat, saliva, sputum, nasal mucus, vomit,  urine, or feces.  Ensure the mask fits over your nose and mouth tightly, and do not touch it during use. Throw out disposable facemasks and gloves after using them. Do not reuse. Wash your hands immediately after removing your facemask and gloves. If your personal clothing becomes contaminated, carefully remove clothing and launder. Wash your hands after handling contaminated clothing. Place all used disposable facemasks, gloves, and other waste in a lined container before disposing them with other household waste. Remove gloves and wash your hands immediately after handling these items.  Do not share dishes, glasses, or other household items with the patient Avoid sharing household items. You should not share dishes, drinking glasses, cups, eating utensils, towels, bedding, or other items with a patient who is confirmed to have, or being evaluated for, COVID-19 infection. After the person uses these items, you should wash them thoroughly with soap and water.  Wash laundry thoroughly Immediately remove and wash clothes or bedding that have blood, body fluids, and/or secretions or excretions, such as sweat, saliva, sputum, nasal mucus, vomit, urine, or feces, on  them. Wear gloves when handling laundry from the patient. Read and follow directions on labels of laundry or clothing items and detergent. In general, wash and dry with the warmest temperatures recommended on the label.  Clean all areas the individual has used often Clean all touchable surfaces, such as counters, tabletops, doorknobs, bathroom fixtures, toilets, phones, keyboards, tablets, and bedside tables, every day. Also, clean any surfaces that may have blood, body fluids, and/or secretions or excretions on them. Wear gloves when cleaning surfaces the patient has come in contact with. Use a diluted bleach solution (e.g., dilute bleach with 1 part bleach and 10 parts water) or a household disinfectant with a label that says  EPA-registered for coronaviruses. To make a bleach solution at home, add 1 tablespoon of bleach to 1 quart (4 cups) of water. For a larger supply, add  cup of bleach to 1 gallon (16 cups) of water. Read labels of cleaning products and follow recommendations provided on product labels. Labels contain instructions for safe and effective use of the cleaning product including precautions you should take when applying the product, such as wearing gloves or eye protection and making sure you have good ventilation during use of the product. Remove gloves and wash hands immediately after cleaning.  Monitor yourself for signs and symptoms of illness Caregivers and household members are considered close contacts, should monitor their health, and will be asked to limit movement outside of the home to the extent possible. Follow the monitoring steps for close contacts listed on the symptom monitoring form.   ? If you have additional questions, contact your local health department or call the epidemiologist on call at [phone] (available 24/7). ? This guidance is subject to change. For the most up-to-date guidance from CDC, please refer to their website: TripMetro.hu   Increase activity slowly   Complete by:  As directed    MYCHART COVID-19 HOME MONITORING PROGRAM   Complete by:  As directed    Is the patient willing to use the MyChart Mobile App for home monitoring?:  Yes   Temperature monitoring   Complete by:  Apr 08, 2019    After how many days would you like to receive a notification of this patient's flowsheet entries?:  1     Allergies as of 04/08/2019   No Known Allergies     Medication List    STOP taking these medications   guaiFENesin-dextromethorphan 100-10 MG/5ML syrup Commonly known as:  ROBITUSSIN DM     TAKE these medications   albuterol 108 (90 Base) MCG/ACT inhaler Commonly known as:  VENTOLIN HFA Inhale 2  puffs into the lungs every 6 (six) hours as needed for wheezing or shortness of breath.   benzonatate 100 MG capsule Commonly known as:  TESSALON Take 1 capsule (100 mg total) by mouth 3 (three) times daily as needed for cough.       No Known Allergies   Consultations:   None   Other Procedures/Studies: Portable Chest 1 View  Result Date: 04/06/2019 CLINICAL DATA:  Shortness of breath EXAM: PORTABLE CHEST 1 VIEW COMPARISON:  04/05/2019 FINDINGS: Low volume AP portable examination with new or increased heterogeneous opacity of the right lung base, concerning for additional developing infection or aspiration. Redemonstrated heterogeneous opacity of the left lung base is not significantly changed. The heart and mediastinum are normal. IMPRESSION: Low volume AP portable examination with new or increased heterogeneous opacity of the right lung base, concerning for additional developing infection or aspiration. Redemonstrated heterogeneous  opacity of the left lung base is not significantly changed. The heart and mediastinum are normal. Electronically Signed   By: Lauralyn PrimesAlex  Bibbey M.D.   On: 04/06/2019 13:51   Dg Chest Portable 1 View  Result Date: 04/05/2019 CLINICAL DATA:  Fever and shortness of breath EXAM: PORTABLE CHEST 1 VIEW COMPARISON:  Chest CT October 26, 2015 FINDINGS: There is airspace opacity in the left lower lobe. There is slight right base atelectasis. Lungs elsewhere clear. Heart size and pulmonary vascularity are normal. No adenopathy. No bone lesions. IMPRESSION: Left lower lobe infiltrate consistent with pneumonia. Slight right base atelectasis. Heart size within normal limits. No evident adenopathy. Electronically Signed   By: Bretta BangWilliam  Woodruff III M.D.   On: 04/05/2019 11:41      TODAY-DAY OF DISCHARGE:  Subjective:   Gerald Santiago today has no headache,no chest abdominal pain,no new weakness tingling or numbness, feels much better wants to go home today.    Objective:   Blood pressure 116/63, pulse (!) 51, temperature 98.1 F (36.7 C), temperature source Oral, resp. rate 18, height 5\' 4"  (1.626 m), weight 78.7 kg, SpO2 98 %.  Intake/Output Summary (Last 24 hours) at 04/08/2019 1007 Last data filed at 04/07/2019 2000 Gross per 24 hour  Intake 100 ml  Output 0 ml  Net 100 ml   Filed Weights   04/06/19 1246  Weight: 78.7 kg    Exam: Awake Alert, Oriented *3, No new F.N deficits, Normal affect [place].AT,PERRAL Supple Neck,No JVD, No cervical lymphadenopathy appriciated.  Symmetrical Chest wall movement, Good air movement bilaterally, CTAB RRR,No Gallops,Rubs or new Murmurs, No Parasternal Heave +ve B.Sounds, Abd Soft, Non tender, No organomegaly appriciated, No rebound -guarding or rigidity. No Cyanosis, Clubbing or edema, No new Rash or bruise   PERTINENT RADIOLOGIC STUDIES: Portable Chest 1 View  Result Date: 04/06/2019 CLINICAL DATA:  Shortness of breath EXAM: PORTABLE CHEST 1 VIEW COMPARISON:  04/05/2019 FINDINGS: Low volume AP portable examination with new or increased heterogeneous opacity of the right lung base, concerning for additional developing infection or aspiration. Redemonstrated heterogeneous opacity of the left lung base is not significantly changed. The heart and mediastinum are normal. IMPRESSION: Low volume AP portable examination with new or increased heterogeneous opacity of the right lung base, concerning for additional developing infection or aspiration. Redemonstrated heterogeneous opacity of the left lung base is not significantly changed. The heart and mediastinum are normal. Electronically Signed   By: Lauralyn PrimesAlex  Bibbey M.D.   On: 04/06/2019 13:51   Dg Chest Portable 1 View  Result Date: 04/05/2019 CLINICAL DATA:  Fever and shortness of breath EXAM: PORTABLE CHEST 1 VIEW COMPARISON:  Chest CT October 26, 2015 FINDINGS: There is airspace opacity in the left lower lobe. There is slight right base atelectasis. Lungs  elsewhere clear. Heart size and pulmonary vascularity are normal. No adenopathy. No bone lesions. IMPRESSION: Left lower lobe infiltrate consistent with pneumonia. Slight right base atelectasis. Heart size within normal limits. No evident adenopathy. Electronically Signed   By: Bretta BangWilliam  Woodruff III M.D.   On: 04/05/2019 11:41     PERTINENT LAB RESULTS: CBC: Recent Labs    04/07/19 0500 04/08/19 0346  WBC 4.8 3.9*  HGB 14.9 15.6  HCT 46.4 47.7  PLT 214 294   CMET CMP     Component Value Date/Time   NA 140 04/08/2019 0346   K 3.8 04/08/2019 0346   CL 105 04/08/2019 0346   CO2 25 04/08/2019 0346   GLUCOSE 92 04/08/2019 0346  BUN 18 04/08/2019 0346   CREATININE 0.68 04/08/2019 0346   CALCIUM 9.3 04/08/2019 0346   PROT 7.4 04/08/2019 0346   ALBUMIN 3.4 (L) 04/08/2019 0346   AST 41 04/08/2019 0346   ALT 78 (H) 04/08/2019 0346   ALKPHOS 55 04/08/2019 0346   BILITOT 0.4 04/08/2019 0346   GFRNONAA >60 04/08/2019 0346   GFRAA >60 04/08/2019 0346    GFR Estimated Creatinine Clearance: 130.3 mL/min (by C-G formula based on SCr of 0.68 mg/dL). No results for input(s): LIPASE, AMYLASE in the last 72 hours. Recent Labs    04/05/19 1132  TROPONINI <0.03   Invalid input(s): POCBNP Recent Labs    04/07/19 0500 04/08/19 0346  DDIMER 0.88* 1.12*   No results for input(s): HGBA1C in the last 72 hours. No results for input(s): CHOL, HDL, LDLCALC, TRIG, CHOLHDL, LDLDIRECT in the last 72 hours. No results for input(s): TSH, T4TOTAL, T3FREE, THYROIDAB in the last 72 hours.  Invalid input(s): FREET3 Recent Labs    04/07/19 0500 04/08/19 0346  FERRITIN 1,742* 1,678*   Coags: No results for input(s): INR in the last 72 hours.  Invalid input(s): PT Microbiology: Recent Results (from the past 240 hour(s))  SARS Coronavirus 2 (CEPHEID- Performed in [facility] hospital lab), Hosp Order     Status: None   Collection Time: 04/05/19 11:32 AM  Result Value Ref Range Status    SARS Coronavirus 2 NEGATIVE NEGATIVE Final    Comment: (NOTE) If result is NEGATIVE SARS-CoV-2 target nucleic acids are NOT DETECTED. The SARS-CoV-2 RNA is generally detectable in upper and lower  respiratory specimens during the acute phase of infection. The lowest  concentration of SARS-CoV-2 viral copies this assay can detect is 250  copies / mL. A negative result does not preclude SARS-CoV-2 infection  and should not be used as the sole basis for treatment or other  patient management decisions.  A negative result may occur with  improper specimen collection / handling, submission of specimen other  than nasopharyngeal swab, presence of viral mutation(s) within the  areas targeted by this assay, and inadequate number of viral copies  (<250 copies / mL). A negative result must be combined with clinical  observations, patient history, and epidemiological information. If result is POSITIVE SARS-CoV-2 target nucleic acids are DETECTED. The SARS-CoV-2 RNA is generally detectable in upper and lower  respiratory specimens dur ing the acute phase of infection.  Positive  results are indicative of active infection with SARS-CoV-2.  Clinical  correlation with patient history and other diagnostic information is  necessary to determine patient infection status.  Positive results do  not rule out bacterial infection or co-infection with other viruses. If result is PRESUMPTIVE POSTIVE SARS-CoV-2 nucleic acids MAY BE PRESENT.   A presumptive positive result was obtained on the submitted specimen  and confirmed on repeat testing.  While 2019 novel coronavirus  (SARS-CoV-2) nucleic acids may be present in the submitted sample  additional confirmatory testing may be necessary for epidemiological  and / or clinical management purposes  to differentiate between  SARS-CoV-2 and other Sarbecovirus currently known to infect humans.  If clinically indicated additional testing with an alternate test    methodology [phone]) is advised. The SARS-CoV-2 RNA is generally  detectable in upper and lower respiratory sp ecimens during the acute  phase of infection. The expected result is Negative. Fact Sheet for Patients:  BoilerBrush.com.cy Fact Sheet for Healthcare Providers: https://pope.com/ This test is not yet approved or cleared by the [place]  [place] FDA and has been authorized for detection and/or diagnosis of SARS-CoV-2 by FDA under an Emergency Use Authorization (EUA).  This EUA will remain in effect (meaning this test can be used) for the duration of the COVID-19 declaration under Section 564(b)(1) of the Act, 21 U.S.C. section 360bbb-3(b)(1), unless the authorization is terminated or revoked sooner. Performed at [facility], [address] [place], [place] [address]   Blood culture (routine x 2)     Status: None (Preliminary result)   Collection Time: 04/05/19 11:32 AM  Result Value Ref Range Status   Specimen Description BLOOD RAC  Final   Special Requests   Final    BOTTLES DRAWN AEROBIC AND ANAEROBIC Blood Culture adequate volume   Culture   Final    NO GROWTH 3 DAYS Performed at [facility], [address] [place], [place] [address]    Report Status PENDING  Incomplete  Blood culture (routine x 2)     Status: None (Preliminary result)   Collection Time: 04/05/19 11:33 AM  Result Value Ref Range Status   Specimen Description BLOOD LAC  Final   Special Requests   Final    BOTTLES DRAWN AEROBIC AND ANAEROBIC Blood Culture adequate volume   Culture   Final    NO GROWTH 3 DAYS Performed at [facility], [address], [place], [place] [address]    Report Status PENDING  Incomplete  Respiratory Panel by PCR     Status: None   Collection Time: 04/06/19  4:55 AM  Result Value Ref Range Status   Adenovirus NOT DETECTED NOT DETECTED Final   Coronavirus 229E NOT DETECTED NOT DETECTED Final     Comment: (NOTE) The Coronavirus on the Respiratory Panel, DOES NOT test for the novel  Coronavirus (2019 nCoV)    Coronavirus HKU1 NOT DETECTED NOT DETECTED Final   Coronavirus NL63 NOT DETECTED NOT DETECTED Final   Coronavirus OC43 NOT DETECTED NOT DETECTED Final   Metapneumovirus NOT DETECTED NOT DETECTED Final   Rhinovirus / Enterovirus NOT DETECTED NOT DETECTED Final   Influenza A NOT DETECTED NOT DETECTED Final   Influenza B NOT DETECTED NOT DETECTED Final   Parainfluenza Virus 1 NOT DETECTED NOT DETECTED Final   Parainfluenza Virus 2 NOT DETECTED NOT DETECTED Final   Parainfluenza Virus 3 NOT DETECTED NOT DETECTED Final   Parainfluenza Virus 4 NOT DETECTED NOT DETECTED Final   Respiratory Syncytial Virus NOT DETECTED NOT DETECTED Final   Bordetella pertussis NOT DETECTED NOT DETECTED Final   Chlamydophila pneumoniae NOT DETECTED NOT DETECTED Final   Mycoplasma pneumoniae NOT DETECTED NOT DETECTED Final    Comment: Performed at [facility], [address] [place], [place] [address]  SARS Coronavirus 2 [facility] order, Performed in [facility] hospital lab)     Status: Abnormal   Collection Time: 04/06/19  4:55 AM  Result Value Ref Range Status   SARS Coronavirus 2 POSITIVE (A) NEGATIVE Final    Comment: CRITICAL RESULT CALLED TO, READ BACK BY AND VERIFIED WITH: YESSICA SORIANO @0602  04/06/19 AKT (NOTE) If result is NEGATIVE SARS-CoV-2 target nucleic acids are NOT DETECTED. The SARS-CoV-2 RNA is generally detectable in upper and lower  respiratory specimens during the acute phase of infection. The lowest  concentration of SARS-CoV-2 viral copies this assay can detect is 250  copies / mL. A negative result does not preclude SARS-CoV-2 infection  and should not be used as the sole basis for treatment or other  patient  management decisions.  A negative result may occur with  improper specimen collection / handling, submission of specimen other  than nasopharyngeal swab,  presence of viral mutation(s) within the  areas targeted by this assay, and inadequate number of viral copies  (<250 copies / mL). A negative result must be combined with clinical  observations, patient history, and epidemiological information. If result is POSITIVE SARS-CoV-2 target nucleic acids are DETEC TED. The SARS-CoV-2 RNA is generally detectable in upper and lower  respiratory specimens during the acute phase of infection.  Positive  results are indicative of active infection with SARS-CoV-2.  Clinical  correlation with patient history and other diagnostic information is  necessary to determine patient infection status.  Positive results do  not rule out bacterial infection or co-infection with other viruses. If result is PRESUMPTIVE POSTIVE SARS-CoV-2 nucleic acids MAY BE PRESENT.   A presumptive positive result was obtained on the submitted specimen  and confirmed on repeat testing.  While 2019 novel coronavirus  (SARS-CoV-2) nucleic acids may be present in the submitted sample  additional confirmatory testing may be necessary for epidemiological  and / or clinical management purposes  to differentiate between  SARS-CoV-2 and other Sarbecovirus currently known to infect humans.  If clinically indicated additional testing with an alternate test  methodology (LAB7 453) is advised. The SARS-CoV-2 RNA is generally  detectable in upper and lower respiratory specimens during the acute  phase of infection. The expected result is Negative. Fact Sheet for Patients:  BoilerBrush.com.cy Fact Sheet for Healthcare Providers: https://pope.com/ This test is not yet approved or cleared by the [place] FDA and has been authorized for detection and/or diagnosis of SARS-CoV-2 by FDA under an Emergency Use Authorization (EUA).  This EUA will remain in effect (meaning this test can be used) for the duration of the COVID-19 declaration under  Section 564(b)(1) of the Act, 21 U.S.C. section 360bbb-3(b)(1), unless the authorization is terminated or revoked sooner. Performed at [facility], [address] [place], [place] [address]     FURTHER DISCHARGE INSTRUCTIONS:  Get Medicines reviewed and adjusted: Please take all your medications with you for your next visit with your Primary MD  Laboratory/radiological data: Please request your Primary MD to go over all hospital tests and procedure/radiological results at the follow up, please ask your Primary MD to get all Hospital records sent to his/her office.  In some cases, they will be blood work, cultures and biopsy results pending at the time of your discharge. Please request that your primary care M.D. goes through all the records of your hospital data and follows up on these results.  Also Note the following: If you experience worsening of your admission symptoms, develop shortness of breath, life threatening emergency, suicidal or homicidal thoughts you must seek medical attention immediately by calling 911 or calling your MD immediately  if symptoms less severe.  You must read complete instructions/literature along with all the possible adverse reactions/side effects for all the Medicines you take and that have been prescribed to you. Take any new Medicines after you have completely understood and accpet all the possible adverse reactions/side effects.   Do not drive when taking Pain medications or sleeping medications (Benzodaizepines)  Do not take more than prescribed Pain, Sleep and Anxiety Medications. It is not advisable to combine anxiety,sleep and pain medications without talking with your primary care practitioner  Special Instructions: If you have smoked or chewed Tobacco  in the last 2 yrs please stop smoking,  stop any regular Alcohol  and or any Recreational drug use.  Wear Seat belts while driving.  Please note: You were cared for by a hospitalist  during your hospital stay. Once you are discharged, your primary care physician will handle any further medical issues. Please note that NO REFILLS for any discharge medications will be authorized once you are discharged, as it is imperative that you return to your primary care physician (or establish a relationship with a primary care physician if you do not have one) for your post hospital discharge needs so that they can reassess your need for medications and monitor your lab values.  Total Time spent coordinating discharge including counseling, education and face to face time equals 25 minutes.  SignedJeoffrey Massed 04/08/2019 10:07 AM

## 2019-04-08 NOTE — Discharge Instructions (Signed)
Person Under Monitoring Name: Gerald Santiago  Location: [address] [place] [id number]   Infection Prevention Recommendations for Individuals Confirmed to have, or Being Evaluated for, 2019 Novel Coronavirus (COVID-19) Infection Who Receive Care at Home  Individuals who are confirmed to have, or are being evaluated for, COVID-19 should follow the prevention steps below until a healthcare provider or local or state health department says they can return to normal activities.  Stay home except to get medical care You should restrict activities outside your home, except for getting medical care. Do not go to work, school, or public areas, and do not use public transportation or taxis.  Call ahead before visiting your doctor Before your medical appointment, call the healthcare provider and tell them that you have, or are being evaluated for, COVID-19 infection. This will help the healthcare providers office take steps to keep other people from getting infected. Ask your healthcare provider to call the local or state health department.  Monitor your symptoms Seek prompt medical attention if your illness is worsening (e.g., difficulty breathing). Before going to your medical appointment, call the healthcare provider and tell them that you have, or are being evaluated for, COVID-19 infection. Ask your healthcare provider to call the local or state health department.  Wear a facemask You should wear a facemask that covers your nose and mouth when you are in the same room with other people and when you visit a healthcare provider. People who live with or visit you should also wear a facemask while they are in the same room with you.  Separate yourself from other people in your home As much as possible, you should stay in a different room from other people in your home. Also, you should use a separate bathroom, if available.  Avoid sharing household items You should not  share dishes, drinking glasses, cups, eating utensils, towels, bedding, or other items with other people in your home. After using these items, you should wash them thoroughly with soap and water.  Cover your coughs and sneezes Cover your mouth and nose with a tissue when you cough or sneeze, or you can cough or sneeze into your sleeve. Throw used tissues in a lined trash can, and immediately wash your hands with soap and water for at least 20 seconds or use an alcohol-based hand rub.  Wash your [employer] Corporationhands Wash your hands often and thoroughly with soap and water for at least 20 seconds. You can use an alcohol-based hand sanitizer if soap and water are not available and if your hands are not visibly dirty. Avoid touching your eyes, nose, and mouth with unwashed hands.   Prevention Steps for Caregivers and Household Members of Individuals Confirmed to have, or Being Evaluated for, COVID-19 Infection Being Cared for in the Home  If you live with, or provide care at home for, a person confirmed to have, or being evaluated for, COVID-19 infection please follow these guidelines to prevent infection:  Follow healthcare providers instructions Make sure that you understand and can help the patient follow any healthcare provider instructions for all care.  Provide for the patients basic needs You should help the patient with basic needs in the home and provide support for getting groceries, prescriptions, and other personal needs.  Monitor the patients symptoms If they are getting sicker, call his or her medical provider and tell them that the patient has, or is being evaluated for, COVID-19 infection. This will help the healthcare providers office  take steps to keep other people from getting infected. Ask the healthcare provider to call the local or state health department.  Limit the number of people who have contact with the patient  If possible, have only one caregiver for the  patient.  Other household members should stay in another home or place of residence. If this is not possible, they should stay  in another room, or be separated from the patient as much as possible. Use a separate bathroom, if available.  Restrict visitors who do not have an essential need to be in the home.  Keep older adults, very young children, and other sick people away from the patient Keep older adults, very young children, and those who have compromised immune systems or chronic health conditions away from the patient. This includes people with chronic heart, lung, or kidney conditions, diabetes, and cancer.  Ensure good ventilation Make sure that shared spaces in the home have good air flow, such as from an air conditioner or an opened window, weather permitting.  Wash your hands often  Wash your hands often and thoroughly with soap and water for at least 20 seconds. You can use an alcohol based hand sanitizer if soap and water are not available and if your hands are not visibly dirty.  Avoid touching your eyes, nose, and mouth with unwashed hands.  Use disposable paper towels to dry your hands. If not available, use dedicated cloth towels and replace them when they become wet.  Wear a facemask and gloves  Wear a disposable facemask at all times in the room and gloves when you touch or have contact with the patients blood, body fluids, and/or secretions or excretions, such as sweat, saliva, sputum, nasal mucus, vomit, urine, or feces.  Ensure the mask fits over your nose and mouth tightly, and do not touch it during use.  Throw out disposable facemasks and gloves after using them. Do not reuse.  Wash your hands immediately after removing your facemask and gloves.  If your personal clothing becomes contaminated, carefully remove clothing and launder. Wash your hands after handling contaminated clothing.  Place all used disposable facemasks, gloves, and other waste in a lined  container before disposing them with other household waste.  Remove gloves and wash your hands immediately after handling these items.  Do not share dishes, glasses, or other household items with the patient  Avoid sharing household items. You should not share dishes, drinking glasses, cups, eating utensils, towels, bedding, or other items with a patient who is confirmed to have, or being evaluated for, COVID-19 infection.  After the person uses these items, you should wash them thoroughly with soap and water.  Wash laundry thoroughly  Immediately remove and wash clothes or bedding that have blood, body fluids, and/or secretions or excretions, such as sweat, saliva, sputum, nasal mucus, vomit, urine, or feces, on them.  Wear gloves when handling laundry from the patient.  Read and follow directions on labels of laundry or clothing items and detergent. In general, wash and dry with the warmest temperatures recommended on the label.  Clean all areas the individual has used often  Clean all touchable surfaces, such as counters, tabletops, doorknobs, bathroom fixtures, toilets, phones, keyboards, tablets, and bedside tables, every day. Also, clean any surfaces that may have blood, body fluids, and/or secretions or excretions on them.  Wear gloves when cleaning surfaces the patient has come in contact with.  Use a diluted bleach solution (e.g., dilute bleach with 1 part  bleach and 10 parts water) or a household disinfectant with a label that says EPA-registered for coronaviruses. To make a bleach solution at home, add 1 tablespoon of bleach to 1 quart (4 cups) of water. For a larger supply, add  cup of bleach to 1 gallon (16 cups) of water.  Read labels of cleaning products and follow recommendations provided on product labels. Labels contain instructions for safe and effective use of the cleaning product including precautions you should take when applying the product, such as wearing gloves or  eye protection and making sure you have good ventilation during use of the product.  Remove gloves and wash hands immediately after cleaning.  Monitor yourself for signs and symptoms of illness Caregivers and household members are considered close contacts, should monitor their health, and will be asked to limit movement outside of the home to the extent possible. Follow the monitoring steps for close contacts listed on the symptom monitoring form.   ? If you have additional questions, contact your local health department or call the epidemiologist on call at [phone] (available 24/7). ? This guidance is subject to change. For the most up-to-date guidance from [facility], please refer to their website: TripMetro.hu      Person Under Monitoring Name: Gerald Santiago  Location: [address] [place] [address]   CORONAVIRUS DISEASE 2019 (COVID-19) Guidance for Persons Under Investigation You are being tested for the virus that causes coronavirus disease 2019 (COVID-19). Public health actions are necessary to ensure protection of your health and the health of others, and to prevent further spread of infection. COVID-19 is caused by a virus that can cause symptoms, such as fever, cough, and shortness of breath. The primary transmission from person to person is by coughing or sneezing. On December 12, 2018, the World Health Organization announced a [employer] Emergency of International Concern and on December 13, 2018 the U.S. Department of Health and Human Services declared a public health emergency. If the virus that causesCOVID-19 spreads in the community, it could have severe public health consequences.  As a person under investigation for COVID-19, the [employer] of Health and [employer] Division of [employer] advises you to adhere to the following guidance until your test results are reported to  you. If your test result is positive, you will receive additional information from your provider and your local health department at that time.   Remain at home until you are cleared by your health provider or public health authorities.   Keep a log of visitors to your home using the form provided. Any visitors to your home must be aware of your isolation status.  If you plan to move to a new address or leave the county, notify the local health department in your county.  Call a doctor or seek care if you have an urgent medical need. Before seeking medical care, call ahead and get instructions from the provider before arriving at the medical office, clinic or hospital. Notify them that you are being tested for the virus that causes COVID-19 so arrangements can be made, as necessary, to prevent transmission to others in the healthcare setting. Next, notify the local health department in your county.  If a medical emergency arises and you need to call 911, inform the first responders that you are being tested for the virus that causes COVID-19. Next, notify the local health department in your county.  Adhere to all guidance set forth by the [facility]  of Public Health for Home Care of patients that is based on guidance from the Center for Disease Control and Prevention with suspected or confirmed COVID-19. It is provided with this guidance for Persons Under Investigation.  Your health and the health of our community are our top priorities. Public Health officials remain available to provide assistance and counseling to you about COVID-19 and compliance with this guidance.  Provider: ____________________________________________________________ Date: ______/_____/_________  By signing below, you acknowledge that you have read and agree to comply with this Guidance for Persons Under Investigation. ______________________________________________________________ Date:  ______/_____/_________  WHO DO I CALL? You can find a list of local health departments here: [email or web address].com/ Health Department: ____________________________________________________________________ Contact Name: ________________________________________________________________________ Telephone: ___________________________________________________________________________  Marice Potter, [place], Communicable Disease Branch COVID-19 Guidance for Persons Under Investigation January 18, 2019

## 2019-04-08 NOTE — TOC Initial Note (Signed)
Transition of Care [facility]) - Initial/Assessment Note    Patient Details  Name: Gerald Santiago MRN: [id number] Date of Birth: 10-05-1991  [facility]) CM/SW Contact:    Durenda Guthrie, RN Phone Number:  [phone]  (Working remotely) 04/08/2019, 12:17 PM  Clinical Narrative:  28 yr old young man admitted for COVID 19 treatment. Thankfully patient has improved enough to prepare for discharge. No HH needs identified. Case Manager has arranged a Tele Health Visit through [facility] and [facility] on Tuesday, April 15, 2019 @ 2:30pm with Dr. Laural Benes .                      Patient Goals and CMS Choice:         Expected Discharge Plan and Services  No Home services needed         Expected Discharge Date: 04/08/19                                    Prior Living Arrangements/Services                       Activities of Daily Living Home Assistive Devices/Equipment: None ADL Screening (condition at time of admission) Patient's cognitive ability adequate to safely complete daily activities?: Yes Is the patient deaf or have difficulty hearing?: No Does the patient have difficulty seeing, even when wearing glasses/contacts?: No Does the patient have difficulty concentrating, remembering, or making decisions?: No Patient able to express need for assistance with ADLs?: Yes Does the patient have difficulty dressing or bathing?: No Independently performs ADLs?: Yes (appropriate for developmental age) Does the patient have difficulty walking or climbing stairs?: No Weakness of Legs: Both Weakness of Arms/Hands: None  Permission Sought/Granted                  Emotional Assessment              Admission diagnosis:  covid-19 positive Patient Active Problem List   Diagnosis Date Noted  . COVID-19 virus infection 04/06/2019  . Acute respiratory failure with hypoxia (HCC) 04/06/2019  . PNA (pneumonia) 04/05/2019   PCP:   Patient, No Pcp Per Pharmacy:   [facility] [address], [place] - [address] [place] [address] Phone: [phone] Fax: [phone]     Social Determinants of Health (SDOH) Interventions    Readmission Risk Interventions No flowsheet data found.

## 2019-04-09 LAB — HIV ANTIBODY (ROUTINE TESTING W REFLEX): HIV Screen 4th Generation wRfx: NONREACTIVE

## 2019-04-09 LAB — LEGIONELLA PNEUMOPHILA SEROGP 1 UR AG: L. pneumophila Serogp 1 Ur Ag: NEGATIVE

## 2019-04-10 LAB — CULTURE, BLOOD (ROUTINE X 2)
Culture: NO GROWTH
Culture: NO GROWTH
Special Requests: ADEQUATE
Special Requests: ADEQUATE

## 2019-04-11 ENCOUNTER — Telehealth (INDEPENDENT_AMBULATORY_CARE_PROVIDER_SITE_OTHER): Payer: Self-pay | Admitting: General Practice

## 2019-04-11 NOTE — Telephone Encounter (Signed)
Pt does not have smartphone or home computer. Not able to complete screenings. Pt has f/u with [facility] and Wellness 6/2

## 2019-04-15 ENCOUNTER — Other Ambulatory Visit: Payer: Self-pay

## 2019-04-15 ENCOUNTER — Ambulatory Visit: Payer: Self-pay | Attending: Internal Medicine | Admitting: Internal Medicine

## 2020-10-24 IMAGING — DX PORTABLE CHEST - 1 VIEW
1 series · 1 of 1 positions shown · non-contrast
Comparison: 04/05/2019

CLINICAL DATA: Shortness of breath

EXAM:
PORTABLE CHEST 1 VIEW

[chest]
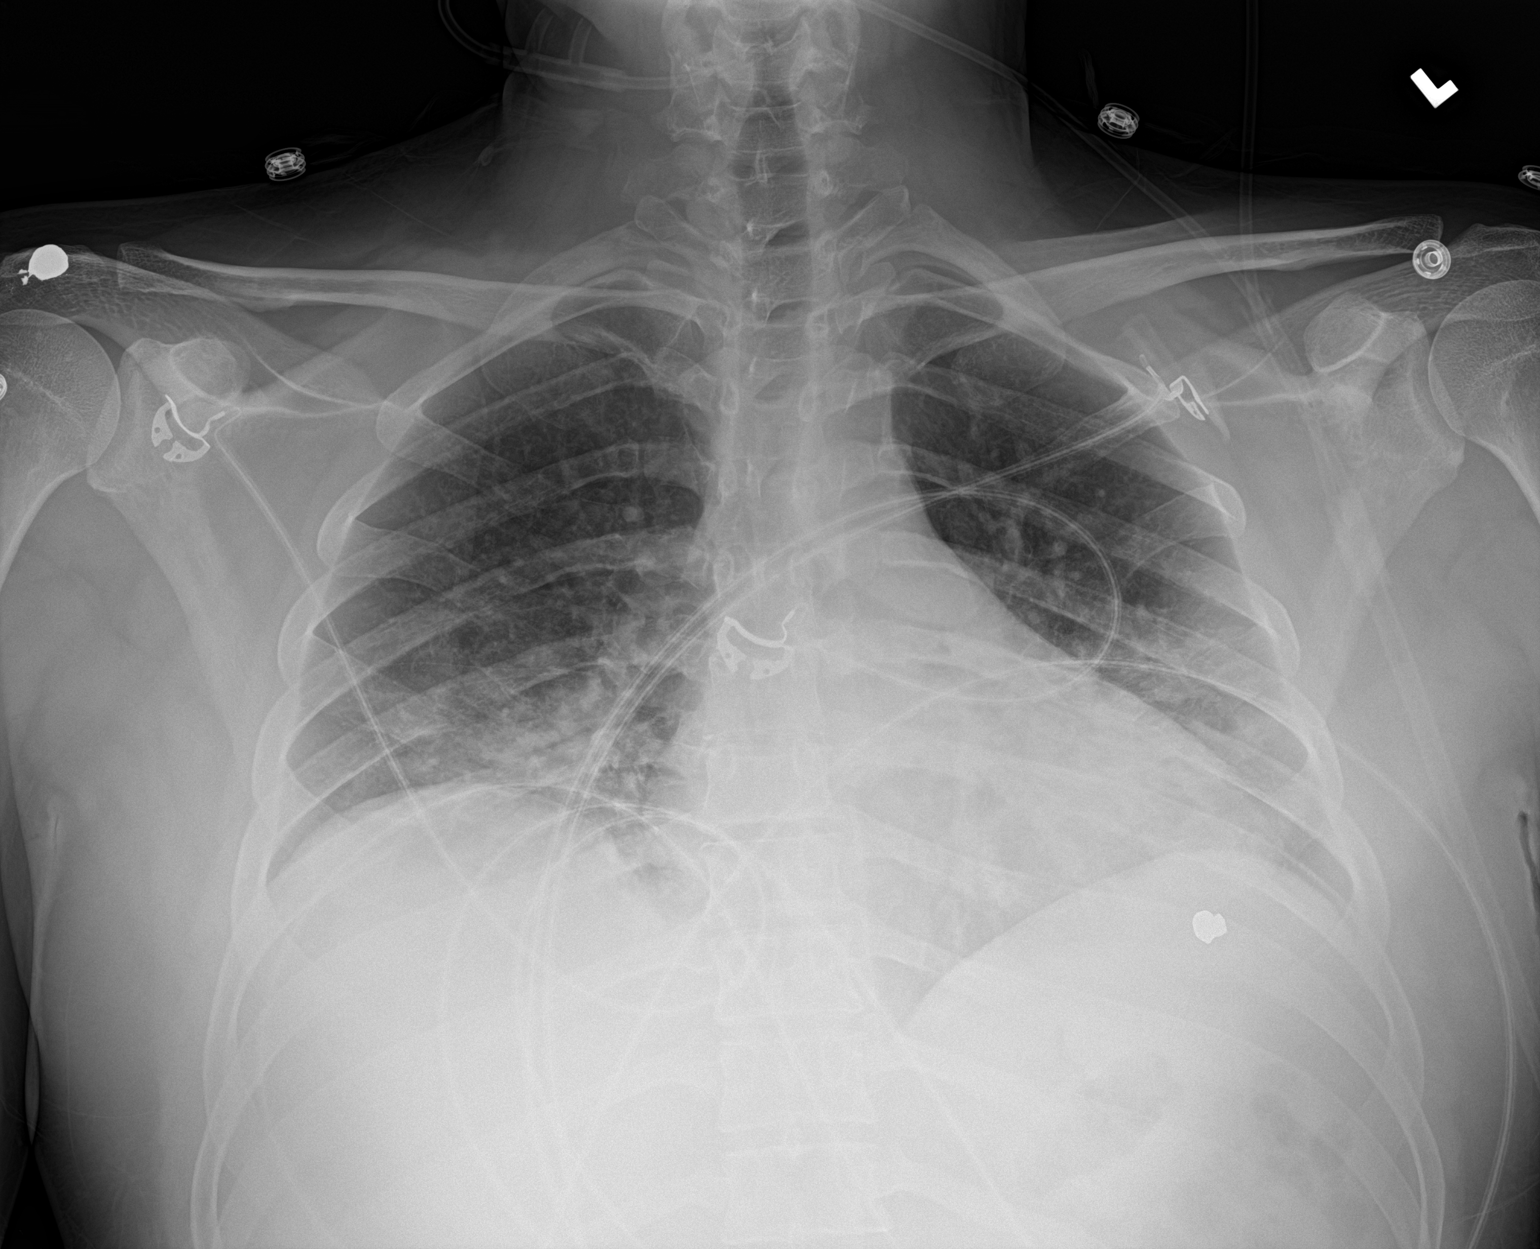

[1 of 1 positions shown; findings below may reference images not displayed]

FINDINGS: Low volume AP portable examination with new or increased
heterogeneous opacity of the right lung base, concerning for
additional developing infection or aspiration. Redemonstrated
heterogeneous opacity of the left lung base is not significantly
changed. The heart and mediastinum are normal.
IMPRESSION: Low volume AP portable examination with new or increased
heterogeneous opacity of the right lung base, concerning for
additional developing infection or aspiration. Redemonstrated
heterogeneous opacity of the left lung base is not significantly
changed. The heart and mediastinum are normal.
